# Patient Record
Sex: Female | Born: 1975 | Race: Black or African American | Hispanic: No | Marital: Married | Smoking: Never smoker
Health system: Southern US, Community
[De-identification: ages and names within clinical notes are randomized; demographics above are authoritative.]

## PROBLEM LIST (undated history)

## (undated) HISTORY — PX: DILATION AND CURETTAGE OF UTERUS: SHX78

---

## 2005-07-28 ENCOUNTER — Emergency Department: Payer: Self-pay | Admitting: Emergency Medicine

## 2006-08-11 ENCOUNTER — Other Ambulatory Visit: Payer: Self-pay

## 2006-08-11 ENCOUNTER — Emergency Department: Payer: Self-pay | Admitting: Emergency Medicine

## 2007-04-05 ENCOUNTER — Emergency Department: Payer: Self-pay | Admitting: Emergency Medicine

## 2008-02-08 ENCOUNTER — Emergency Department: Payer: Self-pay | Admitting: Internal Medicine

## 2008-03-28 ENCOUNTER — Encounter: Payer: Self-pay | Admitting: General Practice

## 2008-04-21 ENCOUNTER — Encounter: Payer: Self-pay | Admitting: General Practice

## 2009-03-25 ENCOUNTER — Emergency Department: Payer: Self-pay | Admitting: Internal Medicine

## 2009-03-27 ENCOUNTER — Emergency Department: Payer: Self-pay | Admitting: Emergency Medicine

## 2009-03-29 ENCOUNTER — Emergency Department: Payer: Self-pay | Admitting: Emergency Medicine

## 2011-03-29 ENCOUNTER — Emergency Department: Payer: Self-pay | Admitting: Emergency Medicine

## 2016-05-12 ENCOUNTER — Other Ambulatory Visit: Payer: Self-pay | Admitting: Family Medicine

## 2016-05-13 LAB — CMP12+LP+TP+TSH+6AC+CBC/D/PLT
ALT: 13 IU/L (ref 0–32)
AST: 13 IU/L (ref 0–40)
Albumin/Globulin Ratio: 1.4 (ref 1.2–2.2)
Albumin: 3.8 g/dL (ref 3.5–5.5)
Alkaline Phosphatase: 51 IU/L (ref 39–117)
BASOS: 1 %
BUN / CREAT RATIO: 7 — AB (ref 9–23)
BUN: 6 mg/dL (ref 6–24)
Basophils Absolute: 0 10*3/uL (ref 0.0–0.2)
Bilirubin Total: 0.3 mg/dL (ref 0.0–1.2)
CALCIUM: 8.8 mg/dL (ref 8.7–10.2)
CHOL/HDL RATIO: 2.7 ratio (ref 0.0–4.4)
CREATININE: 0.81 mg/dL (ref 0.57–1.00)
Chloride: 101 mmol/L (ref 96–106)
Cholesterol, Total: 188 mg/dL (ref 100–199)
EOS (ABSOLUTE): 0 10*3/uL (ref 0.0–0.4)
EOS: 1 %
Free Thyroxine Index: 1.9 (ref 1.2–4.9)
GFR calc Af Amer: 101 mL/min/{1.73_m2} (ref >59)
GFR, EST NON AFRICAN AMERICAN: 88 mL/min/{1.73_m2} (ref >59)
GGT: 14 IU/L (ref 0–60)
GLUCOSE: 88 mg/dL (ref 65–99)
Globulin, Total: 2.8 g/dL (ref 1.5–4.5)
HDL: 70 mg/dL (ref >39)
HEMATOCRIT: 39 % (ref 34.0–46.6)
HEMOGLOBIN: 12.6 g/dL (ref 11.1–15.9)
IMMATURE GRANULOCYTES: 0 %
IRON: 58 ug/dL (ref 27–159)
Immature Grans (Abs): 0 10*3/uL (ref 0.0–0.1)
LDH: 191 IU/L (ref 119–226)
LDL Calculated: 106 mg/dL — ABNORMAL HIGH (ref 0–99)
LYMPHS ABS: 1.7 10*3/uL (ref 0.7–3.1)
Lymphs: 32 %
MCH: 29.5 pg (ref 26.6–33.0)
MCHC: 32.3 g/dL (ref 31.5–35.7)
MCV: 91 fL (ref 79–97)
Monocytes Absolute: 0.3 10*3/uL (ref 0.1–0.9)
Monocytes: 6 %
NEUTROS ABS: 3.2 10*3/uL (ref 1.4–7.0)
Neutrophils: 60 %
PHOSPHORUS: 3 mg/dL (ref 2.5–4.5)
POTASSIUM: 4.4 mmol/L (ref 3.5–5.2)
Platelets: 352 10*3/uL (ref 150–379)
RBC: 4.27 x10E6/uL (ref 3.77–5.28)
RDW: 13.8 % (ref 12.3–15.4)
SODIUM: 140 mmol/L (ref 134–144)
T3 Uptake Ratio: 30 % (ref 24–39)
T4 TOTAL: 6.4 ug/dL (ref 4.5–12.0)
TOTAL PROTEIN: 6.6 g/dL (ref 6.0–8.5)
TSH: 1.32 u[IU]/mL (ref 0.450–4.500)
Triglycerides: 59 mg/dL (ref 0–149)
URIC ACID: 4.6 mg/dL (ref 2.5–7.1)
VLDL Cholesterol Cal: 12 mg/dL (ref 5–40)
WBC: 5.3 10*3/uL (ref 3.4–10.8)

## 2016-05-13 LAB — VITAMIN D 25 HYDROXY (VIT D DEFICIENCY, FRACTURES): VIT D 25 HYDROXY: 22.3 ng/mL — AB (ref 30.0–100.0)

## 2016-05-13 LAB — HGB A1C W/O EAG: HEMOGLOBIN A1C: 6 % — AB (ref 4.8–5.6)

## 2017-01-02 ENCOUNTER — Emergency Department: Payer: No Typology Code available for payment source

## 2017-01-02 ENCOUNTER — Encounter: Payer: Self-pay | Admitting: Emergency Medicine

## 2017-01-02 ENCOUNTER — Emergency Department
Admission: EM | Admit: 2017-01-02 | Discharge: 2017-01-02 | Disposition: A | Discharge status: Home / Self Care | Payer: No Typology Code available for payment source | Attending: Emergency Medicine | Admitting: Emergency Medicine

## 2017-01-02 DIAGNOSIS — Y929 Unspecified place or not applicable: Secondary | ICD-10-CM | POA: Insufficient documentation

## 2017-01-02 DIAGNOSIS — M62838 Other muscle spasm: Secondary | ICD-10-CM | POA: Diagnosis not present

## 2017-01-02 DIAGNOSIS — Y999 Unspecified external cause status: Secondary | ICD-10-CM | POA: Diagnosis not present

## 2017-01-02 DIAGNOSIS — Y939 Activity, unspecified: Secondary | ICD-10-CM | POA: Insufficient documentation

## 2017-01-02 DIAGNOSIS — X501XXA Overexertion from prolonged static or awkward postures, initial encounter: Secondary | ICD-10-CM | POA: Diagnosis not present

## 2017-01-02 DIAGNOSIS — S8391XA Sprain of unspecified site of right knee, initial encounter: Secondary | ICD-10-CM | POA: Insufficient documentation

## 2017-01-02 DIAGNOSIS — S8991XA Unspecified injury of right lower leg, initial encounter: Secondary | ICD-10-CM | POA: Diagnosis present

## 2017-01-02 MED ORDER — TRAMADOL HCL 50 MG PO TABS: 50.0000 mg | ORAL_TABLET | Freq: Four times a day (QID) | ORAL | 0 refills | Status: DC | PRN

## 2017-01-02 MED ORDER — CYCLOBENZAPRINE HCL 10 MG PO TABS: 10.0000 mg | ORAL_TABLET | Freq: Three times a day (TID) | ORAL | 0 refills | Status: DC | PRN

## 2017-01-02 MED ORDER — KETOROLAC TROMETHAMINE 30 MG/ML IJ SOLN
30.0000 mg | Freq: Once | INTRAMUSCULAR | Status: AC
  Administered 2017-01-02: 30 mg via INTRAMUSCULAR
  Filled 2017-01-02: qty 1

## 2017-01-02 MED ORDER — MELOXICAM 15 MG PO TABS: 15.0000 mg | ORAL_TABLET | Freq: Every day | ORAL | 0 refills | Status: DC

## 2017-01-02 NOTE — ED Triage Notes (Signed)
Knee pain since November , was seen at [facility] yesterday for increased pain , today felt pop to the lower thigh with a spasm feeling. Ear infection on ABX since Tuesday

## 2017-01-02 NOTE — ED Provider Notes (Signed)
[facility] Emergency Department Provider Note  ____________________________________________  Time seen: Approximately 1:03 PM  I have reviewed the triage vital signs and the nursing notes.   HISTORY  Chief Complaint Knee Pain    HPI Shari Shields is a 41 y.o. female , NAD, presents to emergency department for evaluation of right knee pain. Patient states she has had right knee pain off and on for approximately 2 months. Over the last week the pain seems to be increasingly worse. Felt a pop in the right distal thigh earlier today which cause shooting pain up and down the leg and prompted her visit to the emergency department. States she was seen at a local urgent care yesterday for the pain but no imaging was completed. Denies any recent injury, trauma or fall. Has not noted any redness, swelling or abnormal warmth. Denies back pain, saddle paresthesias or loss of bowel or bladder control. Denies any numbness, weakness, tingling. Does note that she can feel muscle spasms in her right thigh at times.   History reviewed. No pertinent past medical history.  There are no active problems to display for this patient.   Past Surgical History:  Procedure Laterality Date  . CESAREAN SECTION    . DILATION AND CURETTAGE OF UTERUS      Prior to Admission medications   Medication Sig Start Date End Date Taking? Authorizing Provider  cyclobenzaprine (FLEXERIL) 10 MG tablet Take 1 tablet (10 mg total) by mouth 3 (three) times daily as needed for muscle spasms. 01/02/17   Julias Mould L Zaheer Wageman, PA-C  meloxicam (MOBIC) 15 MG tablet Take 1 tablet (15 mg total) by mouth daily. 01/02/17   Pio Eatherly L Brealyn Baril, PA-C  traMADol (ULTRAM) 50 MG tablet Take 1 tablet (50 mg total) by mouth every 6 (six) hours as needed. 01/02/17   Darcel Frane L Tanika Bracco, PA-C    Allergies Patient has no known allergies.  No family history on file.  Social History Social History  Substance Use Topics  . Smoking  status: Never Smoker  . Smokeless tobacco: Never Used  . Alcohol use Yes     Review of Systems  Constitutional: No fever/chills Cardiovascular: No chest pain. Respiratory: No shortness of breath.  Musculoskeletal: Positive right thigh and knee pain. Negative for back pain.  Skin: Negative for rash, Redness, swelling, abnormal warmth, skin sores. Neurological: Negative for numbness, weakness, tingling.  ____________________________________________   PHYSICAL EXAM:  VITAL SIGNS: ED Triage Vitals  Enc Vitals Group     BP 01/02/17 1301 135/72     Pulse Rate 01/02/17 1301 72     Resp 01/02/17 1301 18     Temp 01/02/17 1301 98.4 F (36.9 C)     Temp Source 01/02/17 1301 Oral     SpO2 01/02/17 1301 99 %     Weight 01/02/17 1302 (!) 310 lb (140.6 kg)     Height 01/02/17 1302 5\' 5"  (1.651 m)     Head Circumference --      Peak Flow --      Pain Score 01/02/17 1302 10     Pain Loc --      Pain Edu? --      Excl. in GC? --      Constitutional: Alert and oriented. Well appearing and in no acute distress. Eyes: Conjunctivae are normal.  Head: Atraumatic. Cardiovascular: Good peripheral circulation with 2+ pulses noted in the right lower extremity. Capillary refill is brisk in all digits of the right foot. Respiratory:  Normal respiratory effort without tachypnea or retractions.  Musculoskeletal: Mild tenderness to palpation to the right distal and dorsal thigh muscle without significant muscle spasm.  No laxity with varus or valgus stress of the right knee. Negative patellofemoral grinding. No masses noted to palpation of posterior right knee. No tenderness to palpation of the right hip. Full range of motion of the right ankle and foot without pain or difficulty. No lower extremity tenderness nor edema.  No joint effusions. Neurologic:  Normal speech and language. No gross focal neurologic deficits are appreciated. Sensation to light touch grossly intact by the right lower  extremity. Skin:  Skin is warm, dry and intact. No rash, redness, swelling, abnormal warmth noted. Psychiatric: Mood and affect are normal. Speech and behavior are normal. Patient exhibits appropriate insight and judgement.   ____________________________________________   LABS  None ____________________________________________  EKG  None ____________________________________________  RADIOLOGY I, Hope PigeonJami L Jennaya Pogue, personally viewed and evaluated these images (plain radiographs) as part of my medical decision making, as well as reviewing the written report by the radiologist.  Dg Knee Complete 4 Views Right  Result Date: 01/02/2017 CLINICAL DATA:  Right knee pain for 2 months EXAM: RIGHT KNEE - COMPLETE 4+ VIEW COMPARISON:  None. FINDINGS: No acute fracture. No dislocation.  Unremarkable soft tissues. IMPRESSION: No acute bony pathology. Electronically Signed   By: Jolaine ClickArthur  Hoss M.D.   On: 01/02/2017 14:26    ____________________________________________    PROCEDURES  Procedure(s) performed: None   Procedures   Medications  ketorolac (TORADOL) 30 MG/ML injection 30 mg (30 mg Intramuscular Given 01/02/17 1443)   ____________________________________________   INITIAL IMPRESSION / ASSESSMENT AND PLAN / ED COURSE  Pertinent labs & imaging results that were available during my care of the patient were reviewed by me and considered in my medical decision making (see chart for details).  Clinical Course as of Jan 03 1628  Fri Jan 02, 2017  1500 Patient verbalizes wanting an MRI to further evaluate her knee pain. Discussed that her x-ray at this time was negative in my physical exam did not warrant further imaging at this time. Will refer the patient to orthopedics for further evaluation and treatment.  [JH]    Clinical Course User Index [JH] Justice Milliron L Charlissa Petros, PA-C    Patient's diagnosis is consistent with Right knee sprain and muscle spasms. Patient was given IM Toradol while  in the emergency department and noted mild decrease in pain. Patient will be discharged home with prescriptions for Flexeril, Motrin and Ultram to take as directed. Patient is to follow up with Dr. Martha ClanKrasinski in orthopedics if symptoms persist past this treatment course. Patient is given ED precautions to return to the ED for any worsening or new symptoms.    ____________________________________________  FINAL CLINICAL IMPRESSION(S) / ED DIAGNOSES  Final diagnoses:  Sprain of right knee, unspecified ligament, initial encounter  Muscle spasm      NEW MEDICATIONS STARTED DURING THIS VISIT:  Discharge Medication List as of 01/02/2017  3:13 PM    START taking these medications   Details  cyclobenzaprine (FLEXERIL) 10 MG tablet Take 1 tablet (10 mg total) by mouth 3 (three) times daily as needed for muscle spasms., Starting Fri 01/02/2017, Print    meloxicam (MOBIC) 15 MG tablet Take 1 tablet (15 mg total) by mouth daily., Starting Fri 01/02/2017, Print    traMADol (ULTRAM) 50 MG tablet Take 1 tablet (50 mg total) by mouth every 6 (six) hours as needed., Starting Fri 01/02/2017,  Print             Hope Pigeon, PA-C 01/02/17 1629    Emily Filbert, MD 01/03/17 1501

## 2017-04-02 ENCOUNTER — Ambulatory Visit: Payer: Self-pay | Admitting: Registered Nurse

## 2017-04-02 VITALS — BP 110/80 | HR 81 | Temp 98.1°F

## 2017-04-02 DIAGNOSIS — H6591 Unspecified nonsuppurative otitis media, right ear: Secondary | ICD-10-CM

## 2017-04-02 DIAGNOSIS — J019 Acute sinusitis, unspecified: Secondary | ICD-10-CM

## 2017-04-02 DIAGNOSIS — M25561 Pain in right knee: Secondary | ICD-10-CM

## 2017-04-02 DIAGNOSIS — IMO0001 Reserved for inherently not codable concepts without codable children: Secondary | ICD-10-CM

## 2017-04-02 DIAGNOSIS — H6592 Unspecified nonsuppurative otitis media, left ear: Secondary | ICD-10-CM

## 2017-04-02 MED ORDER — MELOXICAM 15 MG PO TABS: 15.0000 mg | ORAL_TABLET | Freq: Every day | ORAL | 0 refills | Status: DC

## 2017-04-02 MED ORDER — LORATADINE 10 MG PO TABS: 10.0000 mg | ORAL_TABLET | Freq: Every day | ORAL | 1 refills | Status: DC | PRN

## 2017-04-02 NOTE — Patient Instructions (Signed)
Continue mobic  po daily Avoid motrin/ibuprofen/naprosyn/naproxen/advil/aleve while taking mobic Hydrate May apply ice or heat to knee 15 minutes 4 times per day May use ace wrap to provide knee support Recommend weight loss Recommend alternating shoes every other day and buy new pair after every 300-500 miles typically every 3-6 months Start amoxicillin  by mouth twice a day x 10 days Restart flonase 1 spray each nostril at home Shower twice a day Nasal saline 2 sprays each nostril every 2 hours while awake/in shower as needed for congestion   Lateral Collateral Knee Ligament Sprain, Phase I Rehab Ask your health care provider which exercises are safe for you. Do exercises exactly as told by your health care provider and adjust them as directed. It is normal to feel mild stretching, pulling, tightness, or discomfort as you do these exercises, but you should stop right away if you feel sudden pain or your pain gets worse.Do not begin these exercises until told by your health care provider. Stretching and range of motion exercises These exercises warm up your muscles and joints and improve the movement and flexibility of your knee. These exercises also help to relieve pain, numbness, and tingling. Exercise A: Heel slide   1. Lie on your back with both knees straight. If this causes back discomfort, bend your healthy knee, placing your foot flat on the floor. 2. Slowly slide your left / right heel back toward your buttocks until you feel a gentle stretch in the front of your knee or thigh. 3. Hold this position for __________ seconds. 4. Slowly slide your left / right heel to the starting position. Repeat __________ times. Complete this exercise __________ times a day. Strengthening exercises These exercises build strength and endurance in your knee. Endurance is the ability to use your muscles for a long time, even after they get tired. Exercise B: Straight leg raises (    quadriceps) 1. Lie on your back with your left / right leg extended and your other knee bent. 2. Tense the muscles in the front of your left / right thigh. You should see your kneecap slide up or see increased dimpling just above the knee. Your thigh may even shake a bit. 3. Keep these muscles tight as you raise your leg 4-6 inches (10-15 cm) off the floor. Do not let your knee bend. If you cannot do this exercise without bending your knee, tighten the muscles in the front of your left / right thigh but do not lift your leg. 4. Hold this position for __________ seconds. 5. Keep these muscles tense as you lower your leg. 6. Relax the muscles slowly and completely. Repeat __________ times. Complete this exercise __________ times a day. Exercise C: Hamstring curls  If told by your health care provider, do this exercise while wearing ankle weights. Begin with __________ weights. Then increase the weight by 1 lb (0.5 kg) increments. Do not wear ankle weights that are more than __________. 1. Lie on your abdomen with your legs straight. 2. Bend your left / right knee as far as you can comfortably do that. Keep your hips flat on the surface that is under them. When you bend your knee, bring your foot straight toward your buttock. Do not let it fall in or out. 3. Hold this position for __________ seconds. 4. Slowly lower your leg to the starting position. Repeat __________ times. Complete this exercise __________ times a day. Exercise D: Bridge ( hip extensors) 1. Lie on your back  on a firm surface with your knees bent and your feet flat on the floor. 2. Tighten your buttocks muscles and lift your bottom off the floor until your trunk is level with your thighs.  Do not arch your back.  You should feel the muscles working in your buttocks and the back of your thighs. If you do not feel a stretch in these muscles, slide your feet 1-2 inches (2.5-5 cm) farther away from your buttocks. 3. Hold this  position for __________ seconds. 4. Slowly lower your hips to the starting position. 5. Let your buttocks muscles relax completely between repetitions. 6. If this exercise is too easy, try doing it with your arms crossed over your chest or by lifting one leg while your bottom is up off the floor. Repeat __________ times. Complete this exercise __________ times a day. Exercise E: Heel raise 1. Stand with your feet shoulder-width apart. 2. Keep your weight spread evenly over the width of your feet while you rise up on your toes. Use a wall or table to steady yourself, but try not to use it very much for support. 3. If this exercise is too easy, shift your weight toward your left / right leg until you feel challenged. 4. Hold this position for __________ seconds. 5. Slowly lower yourself to the starting position. Repeat __________ times. Complete this exercise __________ times a day. This information is not intended to replace advice given to you by your health care provider. Make sure you discuss any questions you have with your health care provider. Document Released: 12/08/2005 Document Revised: 08/14/2016 Document Reviewed: 10/20/2015 Elsevier Interactive Patient Education  2017 [employer] Allergic Rhinitis Allergic rhinitis is when the mucous membranes in the nose respond to allergens. Allergens are particles in the air that cause your body to have an allergic reaction. This causes you to release allergic antibodies. Through a chain of events, these eventually cause you to release histamine into the blood stream. Although meant to protect the body, it is this release of histamine that causes your discomfort, such as frequent sneezing, congestion, and an itchy, runny nose. What are the causes? Seasonal allergic rhinitis (hay fever) is caused by pollen allergens that may come from grasses, trees, and weeds. Year-round allergic rhinitis (perennial allergic rhinitis) is caused by allergens such  as house dust mites, pet dander, and mold spores. What are the signs or symptoms?  Nasal stuffiness (congestion).  Itchy, runny nose with sneezing and tearing of the eyes. How is this diagnosed? Your health care provider can help you determine the allergen or allergens that trigger your symptoms. If you and your health care provider are unable to determine the allergen, skin or blood testing may be used. Your health care provider will diagnose your condition after taking your health history and performing a physical exam. Your health care provider may assess you for other related conditions, such as asthma, pink eye, or an ear infection. How is this treated? Allergic rhinitis does not have a cure, but it can be controlled by:  Medicines that block allergy symptoms. These may include allergy shots, nasal sprays, and oral antihistamines.  Avoiding the allergen. Hay fever may often be treated with antihistamines in pill or nasal spray forms. Antihistamines block the effects of histamine. There are over-the-counter medicines that may help with nasal congestion and swelling around the eyes. Check with your health care provider before taking or giving this medicine. If avoiding the allergen or the medicine prescribed do not work,  there are many new medicines your health care provider can prescribe. Stronger medicine may be used if initial measures are ineffective. Desensitizing injections can be used if medicine and avoidance does not work. Desensitization is when a patient is given ongoing shots until the body becomes less sensitive to the allergen. Make sure you follow up with your health care provider if problems continue. Follow these instructions at home: It is not possible to completely avoid allergens, but you can reduce your symptoms by taking steps to limit your exposure to them. It helps to know exactly what you are allergic to so that you can avoid your specific triggers. Contact a health care  provider if:  You have a fever.  You develop a cough that does not stop easily (persistent).  You have shortness of breath.  You start wheezing.  Symptoms interfere with normal daily activities. This information is not intended to replace advice given to you by your health care provider. Make sure you discuss any questions you have with your health care provider. Document Released: 09/02/2001 Document Revised: 08/08/2016 Document Reviewed: 08/15/2013 Elsevier Interactive Patient Education  2017 [employer] Otitis Media, Adult Otitis media is redness, soreness, and puffiness (swelling) in the space just behind your eardrum (middle ear). It may be caused by allergies or infection. It often happens along with a cold. Follow these instructions at home:  Take your medicine as told. Finish it even if you start to feel better.  Only take over-the-counter or prescription medicines for pain, discomfort, or fever as told by your doctor.  Follow up with your doctor as told. Contact a doctor if:  You have otitis media only in one ear, or bleeding from your nose, or both.  You notice a lump on your neck.  You are not getting better in 3-5 days.  You feel worse instead of better. Get help right away if:  You have pain that is not helped with medicine.  You have puffiness, redness, or pain around your ear.  You get a stiff neck.  You cannot move part of your face (paralysis).  You notice that the bone behind your ear hurts when you touch it. This information is not intended to replace advice given to you by your health care provider. Make sure you discuss any questions you have with your health care provider. Document Released: 05/26/2008 Document Revised: 05/15/2016 Document Reviewed: 07/05/2013 Elsevier Interactive Patient Education  2017 [employer] Tendinitis Tendinitis is inflammation of a tendon. A tendon is a strong cord of tissue that connects muscle to bone. Tendinitis  can affect any tendon, but it most commonly affects the shoulder tendon (rotator cuff), ankle tendon (Achilles tendon), elbow tendon (triceps tendon), or one of the tendons in the wrist. What are the causes? This condition may be caused by:  Overusing a tendon or muscle. This is common.  Age-related wear and tear.  Injury.  Inflammatory conditions, such as arthritis.  Certain medicines. What increases the risk? This condition is more likely to develop in people who do activities that involve repetitive motions. What are the signs or symptoms? Symptoms of this condition may include:  Pain.  Tenderness.  Mild swelling. How is this diagnosed? This condition is diagnosed with a physical exam. You may also have tests, such as:  Ultrasound. This uses sound waves to make an image of your affected area.  MRI. How is this treated? This condition may be treated by resting, icing, applying pressure (compression), and raising (elevating) the  area above the level of your heart. This is known as RICE therapy. Treatment may also include:  Medicines to help reduce inflammation or to help reduce pain.  Exercises or physical therapy to strengthen and stretch the tendon.  A brace or splint.  Surgery (rare). Follow these instructions at home:   If you have a splint or brace:   Wear the splint or brace as told by your health care provider. Remove it only as told by your health care provider.  Loosen the splint or brace if your fingers or toes tingle, become numb, or turn cold and blue.  Do not take baths, swim, or use a hot tub until your health care provider approves. Ask your health care provider if you can take showers. You may only be allowed to take sponge baths for bathing.  Do not let your splint or brace get wet if it is not waterproof.  If your splint or brace is not waterproof, cover it with a watertight plastic bag when you take a bath or a shower.  Keep the splint or brace  clean. Managing pain, stiffness, and swelling   If directed, apply ice to the affected area.  Put ice in a plastic bag.  Place a towel between your skin and the bag.  Leave the ice on for 20 minutes, 2-3 times a day.  If directed, apply heat to the affected area as often as told by your health care provider. Use the heat source that your health care provider recommends, such as a moist heat pack or a heating pad.  Place a towel between your skin and the heat source.  Leave the heat on for 20-30 minutes.  Remove the heat if your skin turns bright red. This is especially important if you are unable to feel pain, heat, or cold. You may have a greater risk of getting burned.  Move the fingers or toes of the affected limb often, if this applies. This can help to prevent stiffness and lessen swelling.  If directed, elevate the affected area above the level of your heart while you are sitting or lying down. Driving   Do not drive or operate heavy machinery while taking prescription pain medicine.  Ask your health care provider when it is safe to drive if you have a splint or brace on any part of your arm or leg. Activity   Return to your normal activities as told by your health care provider. Ask your health care provider what activities are safe for you.  Rest the affected area as told by your health care provider.  Avoid using the affected area while you are experiencing symptoms of tendinitis.  Do exercises as told by your health care provider. General instructions   If you have a splint, do not put pressure on any part of the splint until it is fully hardened. This may take several hours.  Wear an elastic bandage or compression wrap only as told by your health care provider.  Take over-the-counter and prescription medicines only as told by your health care provider.  Keep all follow-up visits as told by your health care provider. This is important. Contact a health care  provider if:  Your symptoms do not improve.  You develop new, unexplained problems, such as numbness in your hands. This information is not intended to replace advice given to you by your health care provider. Make sure you discuss any questions you have with your health care provider. Document Released: 12/05/2000  Document Revised: 08/07/2016 Document Reviewed: 09/10/2015 Elsevier Interactive Patient Education  2017 [employer]  Sinusitis, Adult Sinusitis is soreness and inflammation of your sinuses. Sinuses are hollow spaces in the bones around your face. They are located:  Around your eyes.  In the middle of your forehead.  Behind your nose.  In your cheekbones. Your sinuses and nasal passages are lined with a stringy fluid (mucus). Mucus normally drains out of your sinuses. When your nasal tissues get inflamed or swollen, the mucus can get trapped or blocked so air cannot flow through your sinuses. This lets bacteria, viruses, and funguses grow, and that leads to infection. Follow these instructions at home: Medicines   Take, use, or apply over-the-counter and prescription medicines only as told by your doctor. These may include nasal sprays.  If you were prescribed an antibiotic medicine, take it as told by your doctor. Do not stop taking the antibiotic even if you start to feel better. Hydrate and Humidify   Drink enough water to keep your pee (urine) clear or pale yellow.  Use a cool mist humidifier to keep the humidity level in your home above 50%.  Breathe in steam for 10-15 minutes, 3-4 times a day or as told by your doctor. You can do this in the bathroom while a hot shower is running.  Try not to spend time in cool or dry air. Rest   Rest as much as possible.  Sleep with your head raised (elevated).  Make sure to get enough sleep each night. General instructions   Put a warm, moist washcloth on your face 3-4 times a day or as told by your doctor. This will  help with discomfort.  Wash your hands often with soap and water. If there is no soap and water, use hand sanitizer.  Do not smoke. Avoid being around people who are smoking (secondhand smoke).  Keep all follow-up visits as told by your doctor. This is important. Contact a doctor if:  You have a fever.  Your symptoms get worse.  Your symptoms do not get better within 10 days. Get help right away if:  You have a very bad headache.  You cannot stop throwing up (vomiting).  You have pain or swelling around your face or eyes.  You have trouble seeing.  You feel confused.  Your neck is stiff.  You have trouble breathing. This information is not intended to replace advice given to you by your health care provider. Make sure you discuss any questions you have with your health care provider. Document Released: 05/26/2008 Document Revised: 08/03/2016 Document Reviewed: 10/03/2015 Elsevier Interactive Patient Education  2017 [employer].

## 2017-04-02 NOTE — Progress Notes (Signed)
Subjective:    Patient ID: Shari Shields, female    DOB: [birth year], 41 y.o.   MRN: [id number]  40y/o african [place] female here reports nasal/sinus congestion that began yesterday. Started with nonproductive cough today. +sore throat, difficultly swallowing. Afebrile at home.  Also taking Tramadol for continued R knee pain. Out of Mobic. Requesting refill or suggestions r/t to knee pain. Dx with R knee sprain January 2018. But pain has continued. Now worse with cold weather. After sitting still for a long time, becomes tight. Has been alternating shoes current pair 3 months old.  Denied locking or giving out.  Occasional swelling hurts in thigh and knee.  Patient felt mobic helped more than the tramadol.  Patient has also tried tylenol extra strength without much relief of pain.  Does not interrupt her sleep.      Review of Systems  Constitutional: Negative for activity change, appetite change, chills, diaphoresis, fatigue, fever and unexpected weight change.  HENT: Positive for congestion, postnasal drip, sore throat and trouble swallowing. Negative for dental problem, drooling, ear discharge, ear pain, facial swelling, hearing loss, mouth sores, nosebleeds, rhinorrhea, sinus pain, sinus pressure, sneezing, tinnitus and voice change.   Eyes: Negative for photophobia, pain, discharge, redness, itching and visual disturbance.  Respiratory: Negative for cough, choking, chest tightness, shortness of breath, wheezing and stridor.   Cardiovascular: Negative for chest pain, palpitations and leg swelling.  Gastrointestinal: Negative for abdominal distention, abdominal pain, blood in stool, constipation, diarrhea, nausea and vomiting.  Endocrine: Negative for cold intolerance and heat intolerance.  Genitourinary: Negative for difficulty urinating, dysuria and hematuria.  Musculoskeletal: Positive for arthralgias, joint swelling and myalgias. Negative for back pain, gait problem, neck pain and  neck stiffness.  Skin: Negative for color change, pallor, rash and wound.  Allergic/Immunologic: Positive for environmental allergies. Negative for food allergies.  Neurological: Negative for dizziness, tremors, seizures, syncope, facial asymmetry, speech difficulty, weakness, light-headedness, numbness and headaches.  Hematological: Negative for adenopathy. Does not bruise/bleed easily.  Psychiatric/Behavioral: Negative for agitation, behavioral problems, confusion and sleep disturbance.       Objective:   Physical Exam  Constitutional: She is oriented to person, place, and time. Vital signs are normal. She appears well-developed and well-nourished. She is active and cooperative.  Non-toxic appearance. She does not have a sickly appearance. She does not appear ill. No distress.  HENT:  Head: Normocephalic and atraumatic.  Right Ear: Hearing, external ear and ear canal normal. Tympanic membrane is erythematous and bulging. A middle ear effusion is present.  Left Ear: Hearing, external ear and ear canal normal. Tympanic membrane is erythematous and bulging. A middle ear effusion is present.  Nose: Mucosal edema and rhinorrhea present. No nose lacerations, sinus tenderness, nasal deformity, septal deviation or nasal septal hematoma. No epistaxis.  No foreign bodies. Right sinus exhibits no maxillary sinus tenderness and no frontal sinus tenderness. Left sinus exhibits no maxillary sinus tenderness and no frontal sinus tenderness.  Mouth/Throat: Uvula is midline and mucous membranes are normal. Mucous membranes are not pale, not dry and not cyanotic. She does not have dentures. No oral lesions. No trismus in the jaw. Normal dentition. No dental abscesses, uvula swelling, lacerations or dental caries. Posterior oropharyngeal edema and posterior oropharyngeal erythema present. No oropharyngeal exudate or tonsillar abscesses.  Left greater than right erythematous bulging injected TM greater than 50% air  fluid level clear; bilateral allergic shiners; bilateral nasal turbinates edema erythema clear discharge; cobblestoning posterior pharynx  Eyes: Conjunctivae, EOM and  lids are normal. Pupils are equal, round, and reactive to light. Right eye exhibits no chemosis, no discharge, no exudate and no hordeolum. No foreign body present in the right eye. Left eye exhibits no chemosis, no discharge, no exudate and no hordeolum. No foreign body present in the left eye. Right conjunctiva is not injected. Right conjunctiva has no hemorrhage. Left conjunctiva is not injected. Left conjunctiva has no hemorrhage. No scleral icterus. Right eye exhibits normal extraocular motion and no nystagmus. Left eye exhibits normal extraocular motion and no nystagmus. Right pupil is round and reactive. Left pupil is round and reactive. Pupils are equal.  Neck: Trachea normal and normal range of motion. Neck supple. No tracheal tenderness, no spinous process tenderness and no muscular tenderness present. No neck rigidity. No tracheal deviation, no edema, no erythema and normal range of motion present. No thyroid mass and no thyromegaly present.  Cardiovascular: Normal rate, regular rhythm, S1 normal, S2 normal, normal heart sounds and intact distal pulses.  PMI is not displaced.  Exam reveals no gallop and no friction rub.   No murmur heard. Pulses:      Popliteal pulses are 2+ on the right side, and 2+ on the left side.  Pulmonary/Chest: Effort normal. No accessory muscle usage or stridor. No respiratory distress. She has no decreased breath sounds. She has wheezes in the right middle field. She has no rhonchi. She has no rales. She exhibits no tenderness.  Intermittent wheeze expiratory RML; speaks full sentences without difficulty  Abdominal: Soft. She exhibits no distension.  Musculoskeletal: Normal range of motion. She exhibits tenderness. She exhibits no edema or deformity.       Right shoulder: Normal.       Left shoulder:  Normal.       Right hip: Normal.       Left hip: Normal.       Right knee: She exhibits LCL laxity. She exhibits normal range of motion, no swelling, no effusion, no ecchymosis, no deformity, no laceration, no erythema, normal alignment, normal patellar mobility, no bony tenderness, normal meniscus and no MCL laxity. Tenderness found. Medial joint line, lateral joint line, LCL and patellar tendon tenderness noted. No MCL tenderness noted.       Left knee: Normal.       Right ankle: Normal.       Left ankle: Normal.       Cervical back: Normal.       Right hand: Normal.       Left hand: Normal.  TTP medial VMO right thigh no edema or erythema noted; Mild laxity with varus stress right knee-LCL; treads worn on patient shoes entire sole; TTP superior, medial and lateral patellar tendons and medial and lateral joint line; negative posterior/anterior drawer sign; negative lachman's test; negative valgus stress test, negative mcmurray's test  Lymphadenopathy:       Head (right side): No submental, no submandibular, no tonsillar, no preauricular, no posterior auricular and no occipital adenopathy present.       Head (left side): No submental, no submandibular, no tonsillar, no preauricular, no posterior auricular and no occipital adenopathy present.    She has no cervical adenopathy.       Right cervical: No superficial cervical, no deep cervical and no posterior cervical adenopathy present.      Left cervical: No superficial cervical, no deep cervical and no posterior cervical adenopathy present.  Neurological: She is alert and oriented to person, place, and time. She has normal strength.  She is not disoriented. She displays no atrophy and no tremor. No cranial nerve deficit or sensory deficit. She exhibits normal muscle tone. She displays no seizure activity. Coordination and gait normal. GCS eye subscore is 4. GCS verbal subscore is 5. GCS motor subscore is 6.  No limp observed; on/off exam table  without difficulty and in/out chair  Skin: Skin is warm, dry and intact. No abrasion, no bruising, no burn, no ecchymosis, no laceration, no lesion, no petechiae and no rash noted. She is not diaphoretic. No cyanosis or erythema. No pallor. Nails show no clubbing.  Psychiatric: She has a normal mood and affect. Her speech is normal and behavior is normal. Judgment and thought content normal. Cognition and memory are normal.  Nursing note and vitals reviewed.         Assessment & Plan:  A-right otitis media acute nonsupportive, left otitis media effusion, acute rhinitis; right knee pain  P- Treatment as ordered.  Start amoxicillin  po BID x 10 days #20 RF0 dispensed from [facility]. Motrin and tylenol at home discussed max doses motrin  po TID prn pain and tylenol  po QID prn pain.   Symptomatic therapy suggested fluids, NSAIDs and rest.  May take Tylenol or Motrin for fevers.  Call or return to clinic as needed if these symptoms worsen or fail to improve as anticipated. Exitcare handout on otitis media given to patient.  Patient verbalized agreement and understanding of treatment plan.   P2:  Hand washing  Supportive treatment.   No evidence of invasive bacterial infection, non toxic and well hydrated.  This is most likely self limiting viral infection.  I do not see where any further testing or imaging is necessary at this time.   I will suggest supportive care, rest, good hygiene and encourage the patient to take adequate fluids.  The patient is to return to clinic or EMERGENCY ROOM if symptoms worsen or change significantly e.g. ear pain, fever, purulent discharge from ears or bleeding.  Exitcare handout on otitis media with effusion given to patient.  Patient verbalized agreement and understanding of treatment plan.    Restart flonase 1 spray each nostril BID at home.  Patient may use normal saline nasal spray as needed.  Consider antihistamine zyrtec  po daily OTC.  Patient may  continue phenylephrine  po QID prn rhinitis.   Avoid triggers if possible.  Shower prior to bedtime if exposed to triggers.  If allergic dust/dust mites recommend mattress/pillow covers/encasements; washing linens, vacuuming, sweeping, dusting weekly.  Call or return to clinic as needed if these symptoms worsen or fail to improve as anticipated.   Exitcare handout on allergic rhinitis given to patient.  Patient verbalized understanding of instructions, agreed with plan of care and had no further questions at this time.  P2:  Avoidance and hand washing  Refilled mobic  po daily #30 RF0.  Discussed with patient avoid motrin/advil/aleve/naproxen/naprosyn while taking mobic.  May still take tylenol  po QID prn. Shoes worn replace and alternate do not wear same pair every day.  Discussed weight loss would be helpful to decrease knee pain.  Discussed typical arthritis pain worse with cold weather.  Reviewed Jan 2018 xray knee right results with patient negative for arthritis/fracture/dislocation.  Consider compression.  Call or return to clinic as needed if these symptoms worsen or fail to improve as anticipated.  Patient verbalized agreement and understanding of treatment plan and had no further questions at this time.   P2:  ROM exercises, Stretching, and weight loss

## 2017-05-14 ENCOUNTER — Ambulatory Visit: Payer: Self-pay | Admitting: *Deleted

## 2017-05-14 VITALS — BP 118/82 | Ht 66.5 in | Wt 313.0 lb

## 2017-05-14 DIAGNOSIS — Z0000 Encounter for general adult medical examination without abnormal findings: Secondary | ICD-10-CM

## 2017-05-14 NOTE — Progress Notes (Signed)
[employer] insurance premium discount evaluation: Labs Drawn. Replacements ROI form signed. Tobacco Free Attestation form signed.  Forms placed in paper chart.  

## 2017-05-15 LAB — CMP12+LP+TP+TSH+6AC+CBC/D/PLT
A/G RATIO: 1.4 (ref 1.2–2.2)
ALBUMIN: 4.1 g/dL (ref 3.5–5.5)
ALK PHOS: 53 IU/L (ref 39–117)
ALT: 15 IU/L (ref 0–32)
AST: 18 IU/L (ref 0–40)
BASOS ABS: 0 10*3/uL (ref 0.0–0.2)
BILIRUBIN TOTAL: 0.3 mg/dL (ref 0.0–1.2)
BUN / CREAT RATIO: 15 (ref 9–23)
BUN: 11 mg/dL (ref 6–24)
Basos: 1 %
CALCIUM: 9.4 mg/dL (ref 8.7–10.2)
CHOLESTEROL TOTAL: 218 mg/dL — AB (ref 100–199)
CREATININE: 0.75 mg/dL (ref 0.57–1.00)
Chloride: 103 mmol/L (ref 96–106)
Chol/HDL Ratio: 3 ratio (ref 0.0–4.4)
EOS (ABSOLUTE): 0.1 10*3/uL (ref 0.0–0.4)
EOS: 1 %
Free Thyroxine Index: 2 (ref 1.2–4.9)
GFR, EST AFRICAN AMERICAN: 115 mL/min/{1.73_m2} (ref >59)
GFR, EST NON AFRICAN AMERICAN: 100 mL/min/{1.73_m2} (ref >59)
GGT: 17 IU/L (ref 0–60)
GLOBULIN, TOTAL: 2.9 g/dL (ref 1.5–4.5)
Glucose: 80 mg/dL (ref 65–99)
HDL: 72 mg/dL (ref >39)
HEMOGLOBIN: 13.1 g/dL (ref 11.1–15.9)
Hematocrit: 40.1 % (ref 34.0–46.6)
Immature Grans (Abs): 0 10*3/uL (ref 0.0–0.1)
Immature Granulocytes: 0 %
Iron: 72 ug/dL (ref 27–159)
LDH: 208 IU/L (ref 119–226)
LDL CALC: 133 mg/dL — AB (ref 0–99)
LYMPHS ABS: 1.6 10*3/uL (ref 0.7–3.1)
Lymphs: 33 %
MCH: 29.3 pg (ref 26.6–33.0)
MCHC: 32.7 g/dL (ref 31.5–35.7)
MCV: 90 fL (ref 79–97)
Monocytes Absolute: 0.3 10*3/uL (ref 0.1–0.9)
Monocytes: 5 %
NEUTROS ABS: 2.9 10*3/uL (ref 1.4–7.0)
Neutrophils: 60 %
PLATELETS: 344 10*3/uL (ref 150–379)
POTASSIUM: 4.4 mmol/L (ref 3.5–5.2)
Phosphorus: 3.5 mg/dL (ref 2.5–4.5)
RBC: 4.47 x10E6/uL (ref 3.77–5.28)
RDW: 13.6 % (ref 12.3–15.4)
SODIUM: 140 mmol/L (ref 134–144)
T3 UPTAKE RATIO: 30 % (ref 24–39)
T4 TOTAL: 6.6 ug/dL (ref 4.5–12.0)
TRIGLYCERIDES: 67 mg/dL (ref 0–149)
TSH: 1.15 u[IU]/mL (ref 0.450–4.500)
Total Protein: 7 g/dL (ref 6.0–8.5)
Uric Acid: 4.2 mg/dL (ref 2.5–7.1)
VLDL Cholesterol Cal: 13 mg/dL (ref 5–40)
WBC: 4.8 10*3/uL (ref 3.4–10.8)

## 2017-05-15 LAB — HGB A1C W/O EAG: HEMOGLOBIN A1C: 5.5 % (ref 4.8–5.6)

## 2017-05-15 LAB — VITAMIN D 25 HYDROXY (VIT D DEFICIENCY, FRACTURES): VIT D 25 HYDROXY: 22.9 ng/mL — AB (ref 30.0–100.0)

## 2017-05-19 NOTE — Progress Notes (Signed)
Lab results reviewed. Discussed increasing vitamin D supplement dose. Has previously taken 50000u weekly. Advised to discuss her options with pcp. Total cholesterol and LDL increased from previous. A1c improved to WNL. Endorses improved overall diet with more vegetables and better portion control. Red meat at least once weekly. Will try to limit more for cholesterol purposes. No routing to pcp requested. Copy provided to pt. No further questions or concerns.

## 2017-11-12 IMAGING — CR DG KNEE COMPLETE 4+V*R*
4 series · 4 of 4 positions shown · non-contrast
Comparison: None.

CLINICAL DATA: Right knee pain for 2 months

EXAM:
RIGHT KNEE - COMPLETE 4+ VIEW

[knee ap]
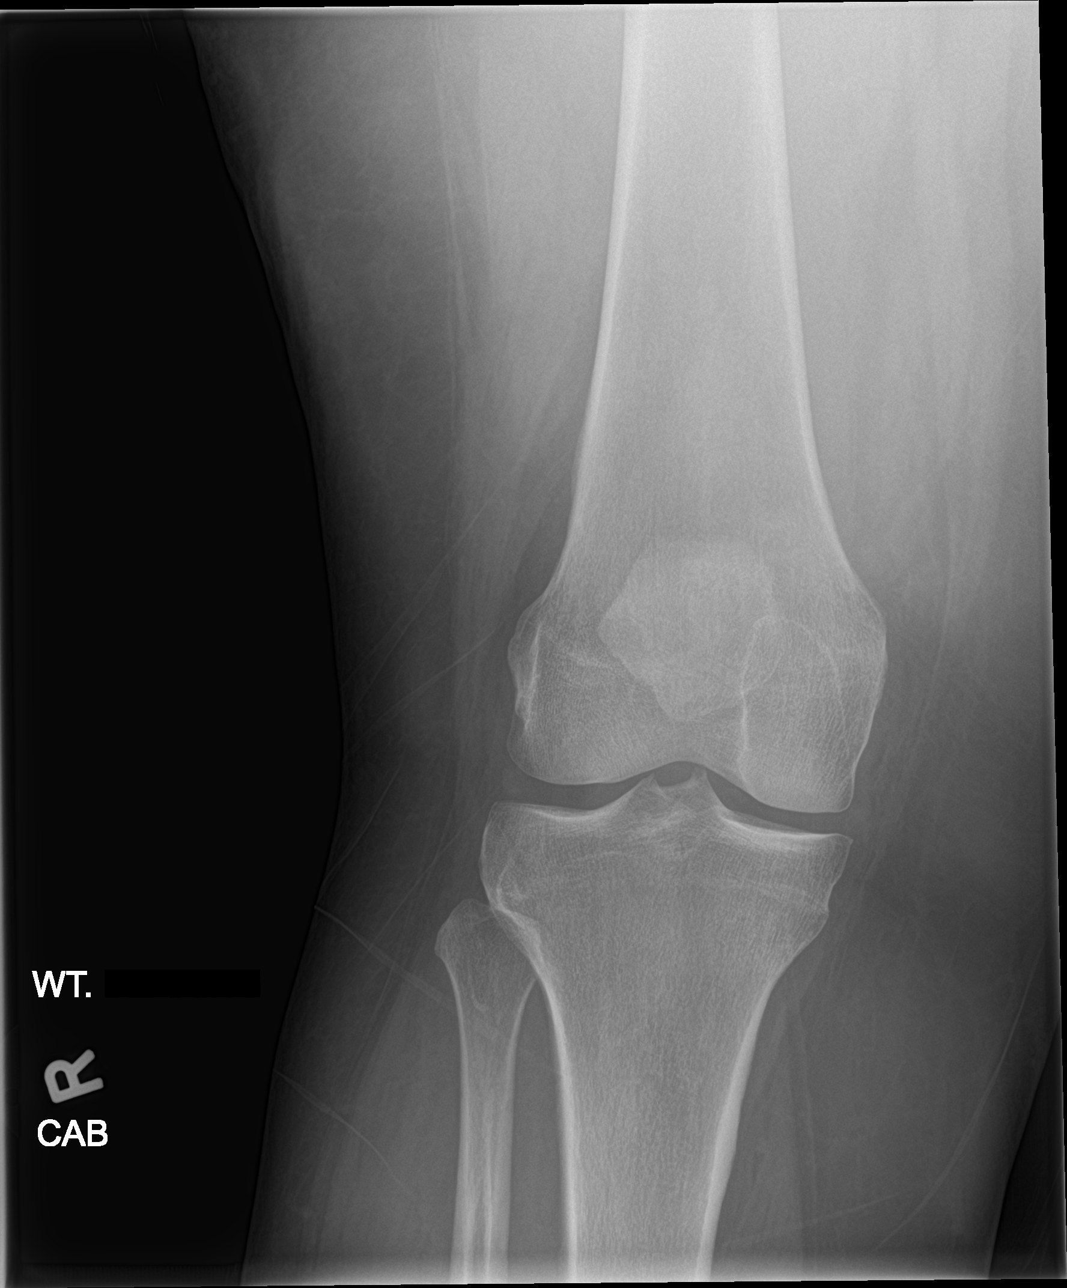

[knee obl]
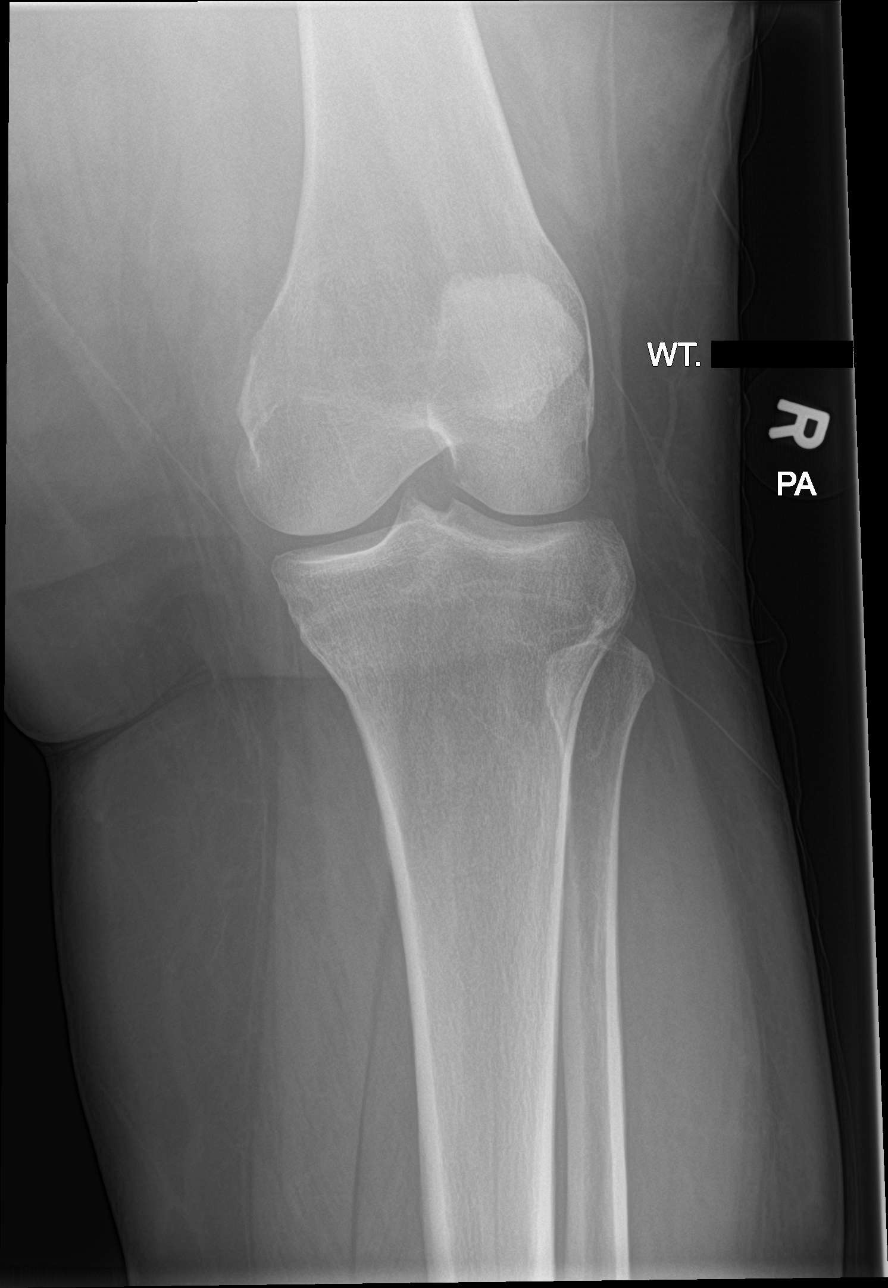

[knee lat]
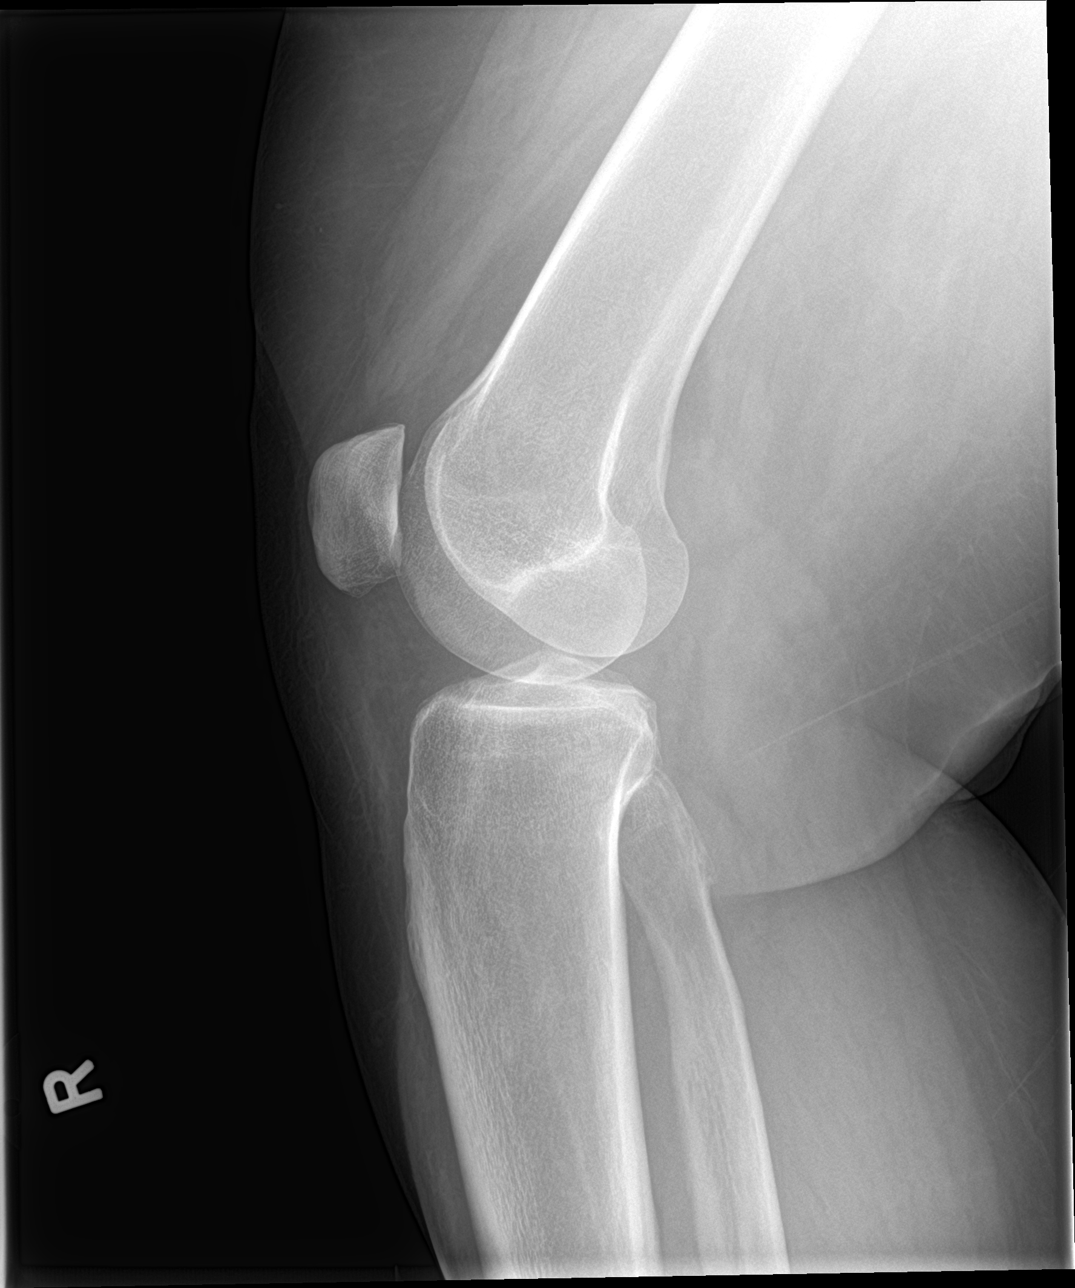

[sunrise]
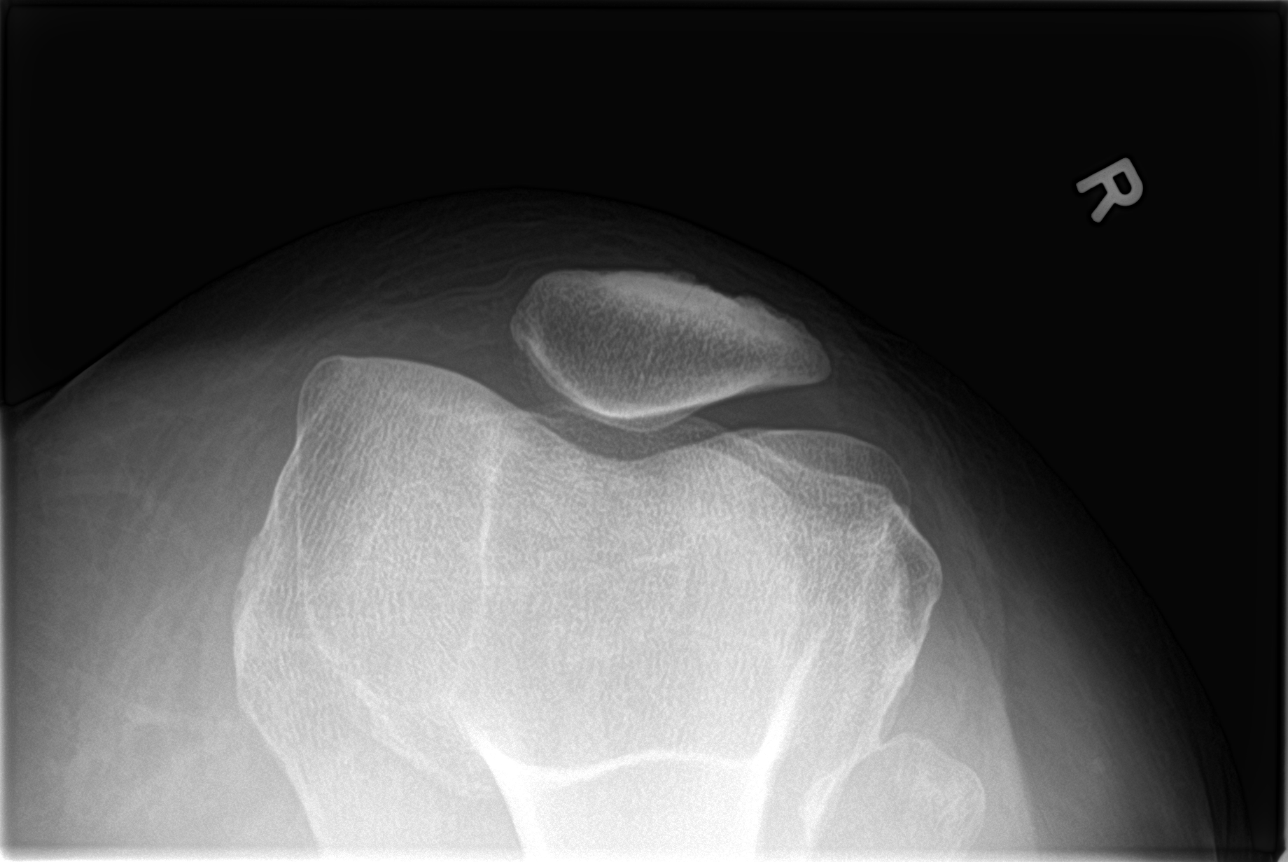

[4 of 4 positions shown; findings below may reference images not displayed]

FINDINGS: No acute fracture. No dislocation.  Unremarkable soft tissues.
IMPRESSION: No acute bony pathology.

## 2017-11-17 ENCOUNTER — Ambulatory Visit: Payer: Self-pay | Admitting: Registered Nurse

## 2017-11-17 VITALS — BP 132/101 | HR 72 | Temp 99.3°F

## 2017-11-17 DIAGNOSIS — H6691 Otitis media, unspecified, right ear: Secondary | ICD-10-CM

## 2017-11-17 DIAGNOSIS — R030 Elevated blood-pressure reading, without diagnosis of hypertension: Secondary | ICD-10-CM

## 2017-11-17 MED ORDER — SALINE SPRAY 0.65 % NA SOLN: 2.0000 | NASAL | 0 refills | Status: DC

## 2017-11-17 MED ORDER — FLUTICASONE PROPIONATE 50 MCG/ACT NA SUSP: 2.0000 | Freq: Every day | NASAL | 0 refills | Status: DC

## 2017-11-17 MED ORDER — AMOXICILLIN 875 MG PO TABS: 875.0000 mg | ORAL_TABLET | Freq: Two times a day (BID) | ORAL | 0 refills | Status: DC

## 2017-11-17 MED ORDER — ACETAMINOPHEN 500 MG PO TABS: 1000.0000 mg | ORAL_TABLET | Freq: Four times a day (QID) | ORAL | 0 refills | Status: AC | PRN

## 2017-11-17 NOTE — Progress Notes (Signed)
Subjective:    Patient ID: Shari Shields, female    DOB: 29-Jul-1976, 41 y.o.   MRN: [id number]  40y/o african american female established Pt c/o L ear pain x1 week. Pain extending down in to L neck.  Intermittent mild discomfort right ear. Denies ear drainage or hearing changes. Has taken motrin for pain prn po.  Reports BP normally 120's/90's. [facility] [place], [place]. Has never been told of BP issues.      Review of Systems  Constitutional: Negative for activity change, appetite change, chills, diaphoresis, fatigue, fever and unexpected weight change.  HENT: Positive for ear pain. Negative for congestion, dental problem, drooling, ear discharge, facial swelling, hearing loss, mouth sores, nosebleeds, postnasal drip, rhinorrhea, sinus pressure, sinus pain, sneezing, sore throat, tinnitus, trouble swallowing and voice change.   Eyes: Negative for photophobia, pain, discharge, redness, itching and visual disturbance.  Respiratory: Negative for cough, choking, chest tightness, shortness of breath, wheezing and stridor.   Cardiovascular: Negative for chest pain, palpitations and leg swelling.  Gastrointestinal: Negative for abdominal distention, abdominal pain, blood in stool, constipation, diarrhea, nausea and vomiting.  Endocrine: Negative for cold intolerance and heat intolerance.  Genitourinary: Negative for difficulty urinating, dysuria and hematuria.  Musculoskeletal: Positive for neck pain. Negative for arthralgias, back pain, gait problem, joint swelling, myalgias and neck stiffness.  Skin: Negative for color change, pallor, rash and wound.  Allergic/Immunologic: Positive for environmental allergies. Negative for food allergies.  Neurological: Positive for headaches. Negative for dizziness, tremors, seizures, syncope, facial asymmetry, speech difficulty, weakness, light-headedness and numbness.  Hematological: Negative for adenopathy. Does not bruise/bleed easily.   Psychiatric/Behavioral: Negative for agitation, behavioral problems, confusion and sleep disturbance.       Objective:   Physical Exam  Constitutional: She is oriented to person, place, and time. She appears well-developed and well-nourished. She is active and cooperative.  Non-toxic appearance. She does not have a sickly appearance. She does not appear ill. No distress.  HENT:  Head: Normocephalic and atraumatic.  Right Ear: Hearing, external ear and ear canal normal. No drainage, swelling or tenderness. No foreign bodies. No mastoid tenderness. Tympanic membrane is injected, erythematous and bulging. Tympanic membrane is not scarred, not perforated and not retracted. A middle ear effusion is present. No hemotympanum. No decreased hearing is noted.  Left Ear: Hearing, external ear and ear canal normal. No drainage, swelling or tenderness. No foreign bodies. No mastoid tenderness. Tympanic membrane is bulging. Tympanic membrane is not injected, not scarred, not perforated, not erythematous and not retracted. A middle ear effusion is present. No hemotympanum. No decreased hearing is noted.  Nose: Mucosal edema and rhinorrhea present. No nose lacerations, sinus tenderness, nasal deformity, septal deviation or nasal septal hematoma. No epistaxis.  No foreign bodies. Right sinus exhibits no maxillary sinus tenderness and no frontal sinus tenderness. Left sinus exhibits no maxillary sinus tenderness and no frontal sinus tenderness.  Mouth/Throat: Uvula is midline and mucous membranes are normal. Mucous membranes are not pale, not dry and not cyanotic. She does not have dentures. No oral lesions. No trismus in the jaw. Normal dentition. No dental abscesses, uvula swelling, lacerations or dental caries. Posterior oropharyngeal edema and posterior oropharyngeal erythema present. No oropharyngeal exudate or tonsillar abscesses.  Cobblestoning posterior pharynx; bilateral TMs air fluid level and bulging; right  TM erythema/injection 90% and adjacent external auditory canal; bilateral allergic shiners  Eyes: Conjunctivae, EOM and lids are normal. Pupils are equal, round, and reactive to light. Right eye  exhibits no chemosis, no discharge, no exudate and no hordeolum. No foreign body present in the right eye. Left eye exhibits no chemosis, no discharge, no exudate and no hordeolum. No foreign body present in the left eye. Right conjunctiva is not injected. Right conjunctiva has no hemorrhage. Left conjunctiva is not injected. Left conjunctiva has no hemorrhage. No scleral icterus. Right eye exhibits normal extraocular motion and no nystagmus. Left eye exhibits normal extraocular motion and no nystagmus. Right pupil is round and reactive. Left pupil is round and reactive. Pupils are equal.  Neck: Trachea normal, normal range of motion and phonation normal. Neck supple. No tracheal tenderness, no spinous process tenderness and no muscular tenderness present. No neck rigidity. No tracheal deviation, no edema, no erythema and normal range of motion present. No thyroid mass and no thyromegaly present.    Cardiovascular: Normal rate, regular rhythm, S1 normal, S2 normal, normal heart sounds and intact distal pulses. PMI is not displaced. Exam reveals no gallop and no friction rub.  No murmur heard. Pulmonary/Chest: Effort normal and breath sounds normal. No accessory muscle usage or stridor. No respiratory distress. She has no decreased breath sounds. She has no wheezes. She has no rhonchi. She has no rales. She exhibits no tenderness.  Abdominal: Soft. Normal appearance. She exhibits no distension, no fluid wave and no ascites. There is no rigidity and no guarding.  Musculoskeletal: Normal range of motion. She exhibits no edema or tenderness.       Right shoulder: Normal.       Left shoulder: Normal.       Right elbow: Normal.      Left elbow: Normal.       Right hip: Normal.       Left hip: Normal.       Right  knee: Normal.       Left knee: Normal.       Cervical back: She exhibits pain. She exhibits normal range of motion, no tenderness, no bony tenderness, no swelling, no edema, no deformity, no laceration, no spasm and normal pulse.       Thoracic back: Normal.       Lumbar back: Normal.       Right hand: Normal.       Left hand: Normal.  Lymphadenopathy:       Head (right side): No submental, no submandibular, no tonsillar, no preauricular, no posterior auricular and no occipital adenopathy present.       Head (left side): No submental, no submandibular, no tonsillar, no preauricular, no posterior auricular and no occipital adenopathy present.    She has no cervical adenopathy.       Right cervical: No superficial cervical, no deep cervical and no posterior cervical adenopathy present.      Left cervical: No superficial cervical, no deep cervical and no posterior cervical adenopathy present.  Neurological: She is alert and oriented to person, place, and time. She has normal strength. She is not disoriented. She displays no atrophy and no tremor. No cranial nerve deficit or sensory deficit. She exhibits normal muscle tone. She displays no seizure activity. Coordination and gait normal. GCS eye subscore is 4. GCS verbal subscore is 5. GCS motor subscore is 6.  On/off exam table; in/out of chair without difficulty; gait sure and steady in hallway  Skin: Skin is warm, dry and intact. No abrasion, no bruising, no burn, no ecchymosis, no laceration, no lesion, no petechiae and no rash noted. She is not diaphoretic. No  cyanosis or erythema. No pallor. Nails show no clubbing.  Psychiatric: She has a normal mood and affect. Her speech is normal and behavior is normal. Judgment and thought content normal. Cognition and memory are normal.  Nursing note and vitals reviewed.         Assessment & Plan:  A- acute otitis media right, elevated blood pressure and obesity  P-Start Amoxicillin 875mg  po BID x 10  days #20 RF0 dispensed from [facility], tylenol 1000mg  po q6h prn pain 6 UD given from clinic stock to patient.  Supportive treatment.   No evidence of invasive bacterial infection, non toxic and well hydrated.  This is most likely self limiting viral infection.  I do not see where any further testing or imaging is necessary at this time.   I will suggest supportive care, rest, good hygiene and encourage the patient to take adequate fluids.  The patient is to return to clinic or EMERGENCY ROOM if symptoms worsen or change significantly e.g. ear pain, fever, purulent discharge from ears or bleeding.  Exitcare handout on otitis media given to patient.  Patient verbalized agreement and understanding of treatment plan and had no further questions at this time.  ER precautions follow up 2 weeks BP recheck with RN Nance PewHaley Workman.   Restart no added sodium diet and exercise program working up to 150 minutes per week.  Recommended weight loss/weight maintenance to BMI 20-25.  Return to the clinic if any new symptoms.  ER if chest pain, SOB, diaphoresis, worst headache of life, visual changes  Patient verbalized agreement and understanding of treatment plan and had no further questions at this time.   P2:  Diet and Exercise specific for HTN

## 2017-11-17 NOTE — Patient Instructions (Signed)
Otitis Media, Adult Otitis media occurs when there is inflammation and fluid in the middle ear. Your middle ear is a part of the ear that contains bones for hearing as well as air that helps send sounds to your brain. What are the causes? This condition is caused by a blockage in the eustachian tube. This tube drains fluid from the ear to the back of the nose (nasopharynx). A blockage in this tube can be caused by an object or by swelling (edema) in the tube. Problems that can cause a blockage include:  A cold or other upper respiratory infection.  Allergies.  An irritant, such as tobacco smoke.  Enlarged adenoids. The adenoids are areas of soft tissue located high in the back of the throat, behind the nose and the roof of the mouth.  A mass in the nasopharynx.  Damage to the ear caused by pressure changes (barotrauma). What are the signs or symptoms? Symptoms of this condition include:  Ear pain.  A fever.  Decreased hearing.  A headache.  Tiredness (lethargy).  Fluid leaking from the ear.  Ringing in the ear. How is this diagnosed? This condition is diagnosed with a physical exam. During the exam your health care provider will use an instrument called an otoscope to look into your ear and check for redness, swelling, and fluid. He or she will also ask about your symptoms. Your health care provider may also order tests, such as:  A test to check the movement of the eardrum (pneumatic otoscopy). This test is done by squeezing a small amount of air into the ear.  A test that changes air pressure in the middle ear to check how well the eardrum moves and whether the eustachian tube is working (tympanogram). How is this treated? This condition usually goes away on its own within 3-5 days. But if the condition is caused by a bacteria infection and does not go away own its own, or keeps coming back, your health care provider may:  Prescribe antibiotic medicines to treat the  infection.  Prescribe or recommend medicines to control pain. Follow these instructions at home:  Take over-the-counter and prescription medicines only as told by your health care provider.  If you were prescribed an antibiotic medicine, take it as told by your health care provider. Do not stop taking the antibiotic even if you start to feel better.  Keep all follow-up visits as told by your health care provider. This is important. Contact a health care provider if:  You have bleeding from your nose.  There is a lump on your neck.  You are not getting better in 5 days.  You feel worse instead of better. Get help right away if:  You have severe pain that is not controlled with medicine.  You have swelling, redness, or pain around your ear.  You have stiffness in your neck.  A part of your face is paralyzed.  The bone behind your ear (mastoid) is tender when you touch it.  You develop a severe headache. Summary  Otitis media is redness, soreness, and swelling of the middle ear.  This condition usually goes away on its own within 3-5 days.  If the problem does not go away in 3-5 days, your health care provider may prescribe or recommend medicines to treat your symptoms.  If you were prescribed an antibiotic medicine, take it as told by your health care provider. This information is not intended to replace advice given to you by your   to you by your health care provider. Make sure you discuss any questions you have with your health care provider. Document Released: 09/12/2004 Document Revised: 11/28/2016 Document Reviewed: 11/28/2016 Elsevier Interactive Patient Education  2017 [employer] Hypertension Hypertension, commonly called high blood pressure, is when the force of blood pumping through the arteries is too strong. The arteries are the blood vessels that carry blood from the heart throughout the body. Hypertension forces the heart to work harder to pump blood and may cause  arteries to become narrow or stiff. Having untreated or uncontrolled hypertension can cause heart attacks, strokes, kidney disease, and other problems. A blood pressure reading consists of a higher number over a lower number. Ideally, your blood pressure should be below 120/80. The first ("top") number is called the systolic pressure. It is a measure of the pressure in your arteries as your heart beats. The second ("bottom") number is called the diastolic pressure. It is a measure of the pressure in your arteries as the heart relaxes. What are the causes? The cause of this condition is not known. What increases the risk? Some risk factors for high blood pressure are under your control. Others are not. Factors you can change  Smoking.  Having type 2 diabetes mellitus, high cholesterol, or both.  Not getting enough exercise or physical activity.  Being overweight.  Having too much fat, sugar, calories, or salt (sodium) in your diet.  Drinking too much alcohol. Factors that are difficult or impossible to change  Having chronic kidney disease.  Having a family history of high blood pressure.  Age. Risk increases with age.  Race. You may be at higher risk if you are African-American.  Gender. Men are at higher risk than women before age 41. After age 41, women are at higher risk than men.  Having obstructive sleep apnea.  Stress. What are the signs or symptoms? Extremely high blood pressure (hypertensive crisis) may cause:  Headache.  Anxiety.  Shortness of breath.  Nosebleed.  Nausea and vomiting.  Severe chest pain.  Jerky movements you cannot control (seizures).  How is this diagnosed? This condition is diagnosed by measuring your blood pressure while you are seated, with your arm resting on a surface. The cuff of the blood pressure monitor will be placed directly against the skin of your upper arm at the level of your heart. It should be measured at least twice  using the same arm. Certain conditions can cause a difference in blood pressure between your right and left arms. Certain factors can cause blood pressure readings to be lower or higher than normal (elevated) for a short period of time:  When your blood pressure is higher when you are in a health care provider's office than when you are at home, this is called white coat hypertension. Most people with this condition do not need medicines.  When your blood pressure is higher at home than when you are in a health care provider's office, this is called masked hypertension. Most people with this condition may need medicines to control blood pressure.  If you have a high blood pressure reading during one visit or you have normal blood pressure with other risk factors:  You may be asked to return on a different day to have your blood pressure checked again.  You may be asked to monitor your blood pressure at home for 1 week or longer.  If you are diagnosed with hypertension, you may have other blood or imaging tests to  help your health care provider understand your overall risk for other conditions. How is this treated? This condition is treated by making healthy lifestyle changes, such as eating healthy foods, exercising more, and reducing your alcohol intake. Your health care provider may prescribe medicine if lifestyle changes are not enough to get your blood pressure under control, and if:  Your systolic blood pressure is above 130.  Your diastolic blood pressure is above 80.  Your personal target blood pressure may vary depending on your medical conditions, your age, and other factors. Follow these instructions at home: Eating and drinking  Eat a diet that is high in fiber and potassium, and low in sodium, added sugar, and fat. An example eating plan is called the DASH (Dietary Approaches to Stop Hypertension) diet. To eat this way: ? Eat plenty of fresh fruits and vegetables. Try to fill  half of your plate at each meal with fruits and vegetables. ? Eat whole grains, such as whole wheat pasta, brown rice, or whole grain bread. Fill about one quarter of your plate with whole grains. ? Eat or drink low-fat dairy products, such as skim milk or low-fat yogurt. ? Avoid fatty cuts of meat, processed or cured meats, and poultry with skin. Fill about one quarter of your plate with lean proteins, such as fish, chicken without skin, beans, eggs, and tofu. ? Avoid premade and processed foods. These tend to be higher in sodium, added sugar, and fat.  Reduce your daily sodium intake. Most people with hypertension should eat less than 1,500 mg of sodium a day.  Limit alcohol intake to no more than 1 drink a day for nonpregnant women and 2 drinks a day for men. One drink equals 12 oz of beer, 5 oz of wine, or 1 oz of hard liquor. Lifestyle  Work with your health care provider to maintain a healthy body weight or to lose weight. Ask what an ideal weight is for you.  Get at least 30 minutes of exercise that causes your heart to beat faster (aerobic exercise) most days of the week. Activities may include walking, swimming, or biking.  Include exercise to strengthen your muscles (resistance exercise), such as pilates or lifting weights, as part of your weekly exercise routine. Try to do these types of exercises for 30 minutes at least 3 days a week.  Do not use any products that contain nicotine or tobacco, such as cigarettes and e-cigarettes. If you need help quitting, ask your health care provider.  Monitor your blood pressure at home as told by your health care provider.  Keep all follow-up visits as told by your health care provider. This is important. Medicines  Take over-the-counter and prescription medicines only as told by your health care provider. Follow directions carefully. Blood pressure medicines must be taken as prescribed.  Do not skip doses of blood pressure medicine. Doing  this puts you at risk for problems and can make the medicine less effective.  Ask your health care provider about side effects or reactions to medicines that you should watch for. Contact a health care provider if:  You think you are having a reaction to a medicine you are taking.  You have headaches that keep coming back (recurring).  You feel dizzy.  You have swelling in your ankles.  You have trouble with your vision. Get help right away if:  You develop a severe headache or confusion.  You have unusual weakness or numbness.  You feel faint.  You  have severe pain in your chest or abdomen.  You vomit repeatedly.  You have trouble breathing. Summary  Hypertension is when the force of blood pumping through your arteries is too strong. If this condition is not controlled, it may put you at risk for serious complications.  Your personal target blood pressure may vary depending on your medical conditions, your age, and other factors. For most people, a normal blood pressure is less than 120/80.  Hypertension is treated with lifestyle changes, medicines, or a combination of both. Lifestyle changes include weight loss, eating a healthy, low-sodium diet, exercising more, and limiting alcohol. This information is not intended to replace advice given to you by your health care provider. Make sure you discuss any questions you have with your health care provider. Document Released: 12/08/2005 Document Revised: 11/05/2016 Document Reviewed: 11/05/2016 Elsevier Interactive Patient Education  [employer].

## 2018-03-16 ENCOUNTER — Ambulatory Visit: Payer: Self-pay | Admitting: Registered Nurse

## 2018-03-16 VITALS — BP 119/102 | HR 78 | Temp 99.0°F

## 2018-03-16 DIAGNOSIS — J0101 Acute recurrent maxillary sinusitis: Secondary | ICD-10-CM

## 2018-03-16 DIAGNOSIS — R030 Elevated blood-pressure reading, without diagnosis of hypertension: Secondary | ICD-10-CM

## 2018-03-16 DIAGNOSIS — H6593 Unspecified nonsuppurative otitis media, bilateral: Secondary | ICD-10-CM

## 2018-03-16 MED ORDER — AMOXICILLIN-POT CLAVULANATE 875-125 MG PO TABS: 1.0000 | ORAL_TABLET | Freq: Two times a day (BID) | ORAL | 0 refills | Status: AC

## 2018-03-16 MED ORDER — SALINE SPRAY 0.65 % NA SOLN
2.0000 | NASAL | 0 refills | Status: DC
Start: 2018-03-16 — End: 2018-09-28

## 2018-03-16 MED ORDER — MELOXICAM 15 MG PO TABS: 15.0000 mg | ORAL_TABLET | Freq: Every day | ORAL | 0 refills | Status: AC

## 2018-03-16 MED ORDER — FLUTICASONE PROPIONATE 50 MCG/ACT NA SUSP: 1.0000 | Freq: Two times a day (BID) | NASAL | 6 refills | Status: DC | PRN

## 2018-03-16 MED ORDER — AMOXICILLIN-POT CLAVULANATE 875-125 MG PO TABS: 1.0000 | ORAL_TABLET | Freq: Two times a day (BID) | ORAL | 0 refills | Status: DC

## 2018-03-16 NOTE — Progress Notes (Signed)
Subjective:    Patient ID: Shari Shields, female    DOB: 29-Jan-1976, 42 y.o.   MRN: [id number]  41y/o african american female established Pt reports frontal sinus pressure x3 weeks. Denies pressure eyes. Yesterday began having pain/pressure L ear. +Productive cough, sore throat. Denies fever at home. Has been using Flonase, Claritin, Dayquil/Nyquil, AlkaSeltzer Cold, hot tea/honey/lemon at home with mild temporary relief. + sick contacts at home and work     Review of Systems  Constitutional: Negative for activity change, appetite change, chills, diaphoresis, fatigue, fever and unexpected weight change.  HENT: Positive for congestion, ear pain, postnasal drip, sinus pressure and sore throat. Negative for dental problem, drooling, ear discharge, facial swelling, hearing loss, mouth sores, nosebleeds, rhinorrhea, sinus pain, sneezing, tinnitus, trouble swallowing and voice change.   Eyes: Negative for photophobia, pain, discharge, redness, itching and visual disturbance.  Respiratory: Positive for cough. Negative for choking, chest tightness, shortness of breath, wheezing and stridor.   Cardiovascular: Negative for chest pain, palpitations and leg swelling.  Gastrointestinal: Negative for abdominal distention, abdominal pain, blood in stool, constipation, diarrhea, nausea and vomiting.  Endocrine: Negative for cold intolerance and heat intolerance.  Genitourinary: Negative for difficulty urinating, dysuria and hematuria.  Musculoskeletal: Negative for arthralgias, back pain, gait problem, joint swelling, myalgias, neck pain and neck stiffness.  Skin: Negative for color change, pallor, rash and wound.  Allergic/Immunologic: Positive for environmental allergies. Negative for food allergies.  Neurological: Positive for headaches. Negative for dizziness, tremors, seizures, syncope, facial asymmetry, speech difficulty, weakness, light-headedness and numbness.  Hematological: Negative for  adenopathy. Does not bruise/bleed easily.  Psychiatric/Behavioral: Negative for agitation, behavioral problems, confusion and sleep disturbance.       Objective:   Physical Exam  Constitutional: She is oriented to person, place, and time. She appears well-developed and well-nourished. She is active and cooperative.  Non-toxic appearance. She does not have a sickly appearance. She appears ill. No distress.  HENT:  Head: Normocephalic and atraumatic.  Right Ear: Hearing, external ear and ear canal normal. Tympanic membrane is erythematous and bulging. A middle ear effusion is present.  Left Ear: Hearing, external ear and ear canal normal. Tympanic membrane is erythematous and bulging. A middle ear effusion is present.  Nose: Mucosal edema and rhinorrhea present. No nose lacerations, sinus tenderness, nasal deformity, septal deviation or nasal septal hematoma. No epistaxis.  No foreign bodies. Right sinus exhibits maxillary sinus tenderness. Right sinus exhibits no frontal sinus tenderness. Left sinus exhibits maxillary sinus tenderness. Left sinus exhibits no frontal sinus tenderness.  Mouth/Throat: Uvula is midline and mucous membranes are normal. Mucous membranes are not pale, not dry and not cyanotic. No oral lesions. No trismus in the jaw. Normal dentition. No dental abscesses, uvula swelling, lacerations or dental caries. Posterior oropharyngeal edema and posterior oropharyngeal erythema present. No oropharyngeal exudate or tonsillar abscesses.  Right greater than left TM erythema bulging air fluid level; maxillary sinuses bilaterally TTP; cobblestoning posterior pharynx; bilateral allergic shiners; nasal turbinates edema/erythema clear discharge  Eyes: Pupils are equal, round, and reactive to light. Conjunctivae, EOM and lids are normal. Right eye exhibits no chemosis, no discharge, no exudate and no hordeolum. No foreign body present in the right eye. Left eye exhibits no chemosis, no discharge,  no exudate and no hordeolum. No foreign body present in the left eye. Right conjunctiva is not injected. Right conjunctiva has no hemorrhage. Left conjunctiva is not injected. Left conjunctiva has no hemorrhage. No scleral icterus. Right eye exhibits normal extraocular  motion and no nystagmus. Left eye exhibits normal extraocular motion and no nystagmus. Right pupil is round and reactive. Left pupil is round and reactive. Pupils are equal.  Neck: Trachea normal, normal range of motion and phonation normal. Neck supple. No tracheal tenderness and no muscular tenderness present. No neck rigidity. No tracheal deviation, no edema, no erythema and normal range of motion present. No thyroid mass and no thyromegaly present.  Cardiovascular: Normal rate, regular rhythm, S1 normal, S2 normal and intact distal pulses. PMI is not displaced. Exam reveals no gallop, no distant heart sounds and no friction rub.  No murmur heard. Pulmonary/Chest: Effort normal and breath sounds normal. No accessory muscle usage or stridor. No respiratory distress. She has no decreased breath sounds. She has no wheezes. She has no rhonchi. She has no rales. She exhibits no tenderness.  No cough observed in exam room; spoke full sentences without difficulty  Abdominal: Soft. Normal appearance. She exhibits no distension, no fluid wave and no ascites. There is no rigidity and no guarding.  Musculoskeletal: Normal range of motion. She exhibits no edema, tenderness or deformity.       Right shoulder: Normal.       Left shoulder: Normal.       Right elbow: Normal.      Left elbow: Normal.       Right hip: Normal.       Left hip: Normal.       Right knee: Normal.       Left knee: Normal.       Cervical back: Normal.       Thoracic back: Normal.       Lumbar back: Normal.       Right hand: Normal.       Left hand: Normal.  Lymphadenopathy:       Head (right side): No submental, no submandibular, no tonsillar, no preauricular, no  posterior auricular and no occipital adenopathy present.       Head (left side): No submental, no submandibular, no tonsillar, no preauricular, no posterior auricular and no occipital adenopathy present.    She has no cervical adenopathy.       Right cervical: No superficial cervical, no deep cervical and no posterior cervical adenopathy present.      Left cervical: No superficial cervical, no deep cervical and no posterior cervical adenopathy present.  Neurological: She is alert and oriented to person, place, and time. She has normal strength. She is not disoriented. She displays no atrophy and no tremor. No cranial nerve deficit or sensory deficit. She exhibits normal muscle tone. She displays no seizure activity. Coordination and gait normal. GCS eye subscore is 4. GCS verbal subscore is 5. GCS motor subscore is 6.  On/off exam table; in/out of chair without difficulty; gait sure and steady in hallway  Skin: Skin is warm, dry and intact. No abrasion, no bruising, no burn, no ecchymosis, no laceration, no lesion, no petechiae and no rash noted. She is not diaphoretic. No cyanosis or erythema. No pallor. Nails show no clubbing.  Psychiatric: She has a normal mood and affect. Her speech is normal and behavior is normal. Judgment and thought content normal. Cognition and memory are normal.  Nursing note and vitals reviewed.         Assessment & Plan:  A-recurrent maxillary sinusitis acute; bilateral otitis media acute nonsupportive; elevated blood pressure  P-follow up with RN Rolly Salter for repeat blood pressure when off OTC cough and cold medicine.  Consider stopping OTC medications except plain tylenol, daily dose or zyrtec or claritin, flonase and nasal saline.   ER if chest pain, dyspnea, worst headache of life and/or visual changes for re-evaluation patient verbalized understanding information/instructions, agreed with plan of care and had no further questions at this time.  continue flonase 1  spray each nostril BID#1 RF6 electronic Rx to her pharmacy of choice, saline 2 sprays each nostril q2h wa prn congestion given 1 bottle from clinic stock.  If no improvement with 48 hours of saline and flonase use start augmentin 875mg  po BID x 10 days #20 RF0.  Electronic Rx given to her pharmacy of choice as out of stock [facility] formulary at this time.  Denied personal or family history of ENT cancer.  Shower BID especially prior to bed. No evidence of systemic bacterial infection, non toxic and well hydrated.  I do not see where any further testing or imaging is necessary at this time.   I will suggest supportive care, rest, good hygiene and encourage the patient to take adequate fluids.  The patient is to return to clinic or EMERGENCY ROOM if symptoms worsen or change significantly.  Exitcare handout on sinusitis and sinus rinse given to patient.  Patient verbalized agreement and understanding of treatment plan and had no further questions at this time.    Start augmentin 875mg  po BID x 10 days #20 RF0 electronic Rx to her pharmacy of choice.  Tylenol 1000mg  po QID prn pain.  Ensure continuing her antihistamine OTC of choice daily.  Supportive treatment.   No evidence of invasive bacterial infection, non toxic and well hydrated.  This is most likely self limiting viral infection.  I do not see where any further testing or imaging is necessary at this time.   I will suggest supportive care, rest, good hygiene and encourage the patient to take adequate fluids.  The patient is to return to clinic or EMERGENCY ROOM if symptoms worsen or change significantly e.g. ear pain, fever, purulent discharge from ears or bleeding.  Exitcare handout on otitis media with effusion given to patient.  Patient verbalized agreement and understanding of treatment plan.   P2:  Hand washing and cover cough  Discussed viral and allergic rhinitis probably the start of her sinus inflammation and fluid back up into ears.  Post nasal drip  causing throat irritation/swelling then eustachian tube dysfunction and cough.  Patient verbalized understanding information and had no further questions at this time.

## 2018-03-17 NOTE — Patient Instructions (Signed)
Hypertension Hypertension, commonly called high blood pressure, is when the force of blood pumping through the arteries is too strong. The arteries are the blood vessels that carry blood from the heart throughout the body. Hypertension forces the heart to work harder to pump blood and may cause arteries to become narrow or stiff. Having untreated or uncontrolled hypertension can cause heart attacks, strokes, kidney disease, and other problems. A blood pressure reading consists of a higher number over a lower number. Ideally, your blood pressure should be below 120/80. The first ("top") number is called the systolic pressure. It is a measure of the pressure in your arteries as your heart beats. The second ("bottom") number is called the diastolic pressure. It is a measure of the pressure in your arteries as the heart relaxes. What are the causes? The cause of this condition is not known. What increases the risk? Some risk factors for high blood pressure are under your control. Others are not. Factors you can change  Smoking.  Having type 2 diabetes mellitus, high cholesterol, or both.  Not getting enough exercise or physical activity.  Being overweight.  Having too much fat, sugar, calories, or salt (sodium) in your diet.  Drinking too much alcohol. Factors that are difficult or impossible to change  Having chronic kidney disease.  Having a family history of high blood pressure.  Age. Risk increases with age.  Race. You may be at higher risk if you are African-American.  Gender. Men are at higher risk than women before age 45. After age 65, women are at higher risk than men.  Having obstructive sleep apnea.  Stress. What are the signs or symptoms? Extremely high blood pressure (hypertensive crisis) may cause:  Headache.  Anxiety.  Shortness of breath.  Nosebleed.  Nausea and vomiting.  Severe chest pain.  Jerky movements you cannot control (seizures).  How is this  diagnosed? This condition is diagnosed by measuring your blood pressure while you are seated, with your arm resting on a surface. The cuff of the blood pressure monitor will be placed directly against the skin of your upper arm at the level of your heart. It should be measured at least twice using the same arm. Certain conditions can cause a difference in blood pressure between your right and left arms. Certain factors can cause blood pressure readings to be lower or higher than normal (elevated) for a short period of time:  When your blood pressure is higher when you are in a health care provider's office than when you are at home, this is called white coat hypertension. Most people with this condition do not need medicines.  When your blood pressure is higher at home than when you are in a health care provider's office, this is called masked hypertension. Most people with this condition may need medicines to control blood pressure.  If you have a high blood pressure reading during one visit or you have normal blood pressure with other risk factors:  You may be asked to return on a different day to have your blood pressure checked again.  You may be asked to monitor your blood pressure at home for 1 week or longer.  If you are diagnosed with hypertension, you may have other blood or imaging tests to help your health care provider understand your overall risk for other conditions. How is this treated? This condition is treated by making healthy lifestyle changes, such as eating healthy foods, exercising more, and reducing your alcohol intake. Your   health care provider may prescribe medicine if lifestyle changes are not enough to get your blood pressure under control, and if:  Your systolic blood pressure is above 130.  Your diastolic blood pressure is above 80.  Your personal target blood pressure may vary depending on your medical conditions, your age, and other factors. Follow these  instructions at home: Eating and drinking  Eat a diet that is high in fiber and potassium, and low in sodium, added sugar, and fat. An example eating plan is called the DASH (Dietary Approaches to Stop Hypertension) diet. To eat this way: ? Eat plenty of fresh fruits and vegetables. Try to fill half of your plate at each meal with fruits and vegetables. ? Eat whole grains, such as whole wheat pasta, brown rice, or whole grain bread. Fill about one quarter of your plate with whole grains. ? Eat or drink low-fat dairy products, such as skim milk or low-fat yogurt. ? Avoid fatty cuts of meat, processed or cured meats, and poultry with skin. Fill about one quarter of your plate with lean proteins, such as fish, chicken without skin, beans, eggs, and tofu. ? Avoid premade and processed foods. These tend to be higher in sodium, added sugar, and fat.  Reduce your daily sodium intake. Most people with hypertension should eat less than 1,500 mg of sodium a day.  Limit alcohol intake to no more than 1 drink a day for nonpregnant women and 2 drinks a day for men. One drink equals 12 oz of beer, 5 oz of wine, or 1 oz of hard liquor. Lifestyle  Work with your health care provider to maintain a healthy body weight or to lose weight. Ask what an ideal weight is for you.  Get at least 30 minutes of exercise that causes your heart to beat faster (aerobic exercise) most days of the week. Activities may include walking, swimming, or biking.  Include exercise to strengthen your muscles (resistance exercise), such as pilates or lifting weights, as part of your weekly exercise routine. Try to do these types of exercises for 30 minutes at least 3 days a week.  Do not use any products that contain nicotine or tobacco, such as cigarettes and e-cigarettes. If you need help quitting, ask your health care provider.  Monitor your blood pressure at home as told by your health care provider.  Keep all follow-up visits as  told by your health care provider. This is important. Medicines  Take over-the-counter and prescription medicines only as told by your health care provider. Follow directions carefully. Blood pressure medicines must be taken as prescribed.  Do not skip doses of blood pressure medicine. Doing this puts you at risk for problems and can make the medicine less effective.  Ask your health care provider about side effects or reactions to medicines that you should watch for. Contact a health care provider if:  You think you are having a reaction to a medicine you are taking.  You have headaches that keep coming back (recurring).  You feel dizzy.  You have swelling in your ankles.  You have trouble with your vision. Get help right away if:  You develop a severe headache or confusion.  You have unusual weakness or numbness.  You feel faint.  You have severe pain in your chest or abdomen.  You vomit repeatedly.  You have trouble breathing. Summary  Hypertension is when the force of blood pumping through your arteries is too strong. If this condition is not   controlled, it may put you at risk for serious complications.  Your personal target blood pressure may vary depending on your medical conditions, your age, and other factors. For most people, a normal blood pressure is less than 120/80.  Hypertension is treated with lifestyle changes, medicines, or a combination of both. Lifestyle changes include weight loss, eating a healthy, low-sodium diet, exercising more, and limiting alcohol. This information is not intended to replace advice given to you by your health care provider. Make sure you discuss any questions you have with your health care provider. Document Released: 12/08/2005 Document Revised: 11/05/2016 Document Reviewed: 11/05/2016 Elsevier Interactive Patient Education  2018 [employer] Sinus Rinse What is a sinus rinse? A sinus rinse is a simple home treatment that is  used to rinse your sinuses with a sterile mixture of salt and water (saline solution). Sinuses are air-filled spaces in your skull behind the bones of your face and forehead that open into your nasal cavity. You will use the following:  Saline solution.  Neti pot or spray bottle. This releases the saline solution into your nose and through your sinuses. Neti pots and spray bottles can be purchased at [employer], a health food store, or online.  When would I do a sinus rinse? A sinus rinse can help to clear mucus, dirt, dust, or pollen from the nasal cavity. You may do a sinus rinse when you have a cold, a virus, nasal allergy symptoms, a sinus infection, or stuffiness in the nose or sinuses. If you are considering a sinus rinse:  Ask your child's health care provider before performing a sinus rinse on your child.  Do not do a sinus rinse if you have had ear or nasal surgery, ear infection, or blocked ears.  How do I do a sinus rinse?  Wash your hands.  Disinfect your device according to the directions provided and then dry it.  Use the solution that comes with your device or one that is sold separately in stores. Follow the mixing directions on the package.  Fill your device with the amount of saline solution as directed by the device instructions.  Stand over a sink and tilt your head sideways over the sink.  Place the spout of the device in your upper nostril (the one closer to the ceiling).  Gently pour or squeeze the saline solution into the nasal cavity. The liquid should drain to the lower nostril if you are not overly congested.  Gently blow your nose. Blowing too hard may cause ear pain.  Repeat in the other nostril.  Clean and rinse your device with clean water and then air-dry it. Are there risks of a sinus rinse? Sinus rinse is generally very safe and effective. However, there are a few risks, which include:  A burning sensation in the sinuses. This may  happen if you do not make the saline solution as directed. Make sure to follow all directions when making the saline solution.  Infection from contaminated water. This is rare, but possible.  Nasal irritation.  This information is not intended to replace advice given to you by your health care provider. Make sure you discuss any questions you have with your health care provider. Document Released: 07/05/2014 Document Revised: 11/04/2016 Document Reviewed: 04/25/2014 Elsevier Interactive Patient Education  2017 [employer] Sinusitis, Adult Sinusitis is soreness and inflammation of your sinuses. Sinuses are hollow spaces in the bones around your face. Your sinuses are located:  Around your eyes.  In  the middle of your forehead.  Behind your nose.  In your cheekbones.  Your sinuses and nasal passages are lined with a stringy fluid (mucus). Mucus normally drains out of your sinuses. When your nasal tissues become inflamed or swollen, the mucus can become trapped or blocked so air cannot flow through your sinuses. This allows bacteria, viruses, and funguses to grow, which leads to infection. Sinusitis can develop quickly and last for 7?10 days (acute) or for more than 12 weeks (chronic). Sinusitis often develops after a cold. What are the causes? This condition is caused by anything that creates swelling in the sinuses or stops mucus from draining, including:  Allergies.  Asthma.  Bacterial or viral infection.  Abnormally shaped bones between the nasal passages.  Nasal growths that contain mucus (nasal polyps).  Narrow sinus openings.  Pollutants, such as chemicals or irritants in the air.  A foreign object stuck in the nose.  A fungal infection. This is rare.  What increases the risk? The following factors may make you more likely to develop this condition:  Having allergies or asthma.  Having had a recent cold or respiratory tract infection.  Having structural  deformities or blockages in your nose or sinuses.  Having a weak immune system.  Doing a lot of swimming or diving.  Overusing nasal sprays.  Smoking.  What are the signs or symptoms? The main symptoms of this condition are pain and a feeling of pressure around the affected sinuses. Other symptoms include:  Upper toothache.  Earache.  Headache.  Bad breath.  Decreased sense of smell and taste.  A cough that may get worse at night.  Fatigue.  Fever.  Thick drainage from your nose. The drainage is often green and it may contain pus (purulent).  Stuffy nose or congestion.  Postnasal drip. This is when extra mucus collects in the throat or back of the nose.  Swelling and warmth over the affected sinuses.  Sore throat.  Sensitivity to light.  How is this diagnosed? This condition is diagnosed based on symptoms, a medical history, and a physical exam. To find out if your condition is acute or chronic, your health care provider may:  Look in your nose for signs of nasal polyps.  Tap over the affected sinus to check for signs of infection.  View the inside of your sinuses using an imaging device that has a light attached (endoscope).  If your health care provider suspects that you have chronic sinusitis, you may also:  Be tested for allergies.  Have a sample of mucus taken from your nose (nasal culture) and checked for bacteria.  Have a mucus sample examined to see if your sinusitis is related to an allergy.  If your sinusitis does not respond to treatment and it lasts longer than 8 weeks, you may have an MRI or CT scan to check your sinuses. These scans also help to determine how severe your infection is. In rare cases, a bone biopsy may be done to rule out more serious types of fungal sinus disease. How is this treated? Treatment for sinusitis depends on the cause and whether your condition is chronic or acute. If a virus is causing your sinusitis, your symptoms  will go away on their own within 10 days. You may be given medicines to relieve your symptoms, including:  Topical nasal decongestants. They shrink swollen nasal passages and let mucus drain from your sinuses.  Antihistamines. These drugs block inflammation that is triggered by allergies. This  can help to ease swelling in your nose and sinuses.  Topical nasal corticosteroids. These are nasal sprays that ease inflammation and swelling in your nose and sinuses.  Nasal saline washes. These rinses can help to get rid of thick mucus in your nose.  If your condition is caused by bacteria, you will be given an antibiotic medicine. If your condition is caused by a fungus, you will be given an antifungal medicine. Surgery may be needed to correct underlying conditions, such as narrow nasal passages. Surgery may also be needed to remove polyps. Follow these instructions at home: Medicines  Take, use, or apply over-the-counter and prescription medicines only as told by your health care provider. These may include nasal sprays.  If you were prescribed an antibiotic medicine, take it as told by your health care provider. Do not stop taking the antibiotic even if you start to feel better. Hydrate and Humidify  Drink enough water to keep your urine clear or pale yellow. Staying hydrated will help to thin your mucus.  Use a cool mist humidifier to keep the humidity level in your home above 50%.  Inhale steam for 10-15 minutes, 3-4 times a day or as told by your health care provider. You can do this in the bathroom while a hot shower is running.  Limit your exposure to cool or dry air. Rest  Rest as much as possible.  Sleep with your head raised (elevated).  Make sure to get enough sleep each night. General instructions  Apply a warm, moist washcloth to your face 3-4 times a day or as told by your health care provider. This will help with discomfort.  Wash your hands often with soap and water to  reduce your exposure to viruses and other germs. If soap and water are not available, use hand sanitizer.  Do not smoke. Avoid being around people who are smoking (secondhand smoke).  Keep all follow-up visits as told by your health care provider. This is important. Contact a health care provider if:  You have a fever.  Your symptoms get worse.  Your symptoms do not improve within 10 days. Get help right away if:  You have a severe headache.  You have persistent vomiting.  You have pain or swelling around your face or eyes.  You have vision problems.  You develop confusion.  Your neck is stiff.  You have trouble breathing. This information is not intended to replace advice given to you by your health care provider. Make sure you discuss any questions you have with your health care provider. Document Released: 12/08/2005 Document Revised: 08/03/2016 Document Reviewed: 10/03/2015 Elsevier Interactive Patient Education  2018 [employer]. Otitis Media, Adult Otitis media occurs when there is inflammation and fluid in the middle ear. Your middle ear is a part of the ear that contains bones for hearing as well as air that helps send sounds to your brain. What are the causes? This condition is caused by a blockage in the eustachian tube. This tube drains fluid from the ear to the back of the nose (nasopharynx). A blockage in this tube can be caused by an object or by swelling (edema) in the tube. Problems that can cause a blockage include:  A cold or other upper respiratory infection.  Allergies.  An irritant, such as tobacco smoke.  Enlarged adenoids. The adenoids are areas of soft tissue located high in the back of the throat, behind the nose and the roof of the mouth.  A mass  in the nasopharynx.  Damage to the ear caused by pressure changes (barotrauma).  What are the signs or symptoms? Symptoms of this condition include:  Ear pain.  A fever.  Decreased  hearing.  A headache.  Tiredness (lethargy).  Fluid leaking from the ear.  Ringing in the ear.  How is this diagnosed? This condition is diagnosed with a physical exam. During the exam your health care provider will use an instrument called an otoscope to look into your ear and check for redness, swelling, and fluid. He or she will also ask about your symptoms. Your health care provider may also order tests, such as:  A test to check the movement of the eardrum (pneumatic otoscopy). This test is done by squeezing a small amount of air into the ear.  A test that changes air pressure in the middle ear to check how well the eardrum moves and whether the eustachian tube is working (tympanogram).  How is this treated? This condition usually goes away on its own within 3-5 days. But if the condition is caused by a bacteria infection and does not go away own its own, or keeps coming back, your health care provider may:  Prescribe antibiotic medicines to treat the infection.  Prescribe or recommend medicines to control pain.  Follow these instructions at home:  Take over-the-counter and prescription medicines only as told by your health care provider.  If you were prescribed an antibiotic medicine, take it as told by your health care provider. Do not stop taking the antibiotic even if you start to feel better.  Keep all follow-up visits as told by your health care provider. This is important. Contact a health care provider if:  You have bleeding from your nose.  There is a lump on your neck.  You are not getting better in 5 days.  You feel worse instead of better. Get help right away if:  You have severe pain that is not controlled with medicine.  You have swelling, redness, or pain around your ear.  You have stiffness in your neck.  A part of your face is paralyzed.  The bone behind your ear (mastoid) is tender when you touch it.  You develop a severe  headache. Summary  Otitis media is redness, soreness, and swelling of the middle ear.  This condition usually goes away on its own within 3-5 days.  If the problem does not go away in 3-5 days, your health care provider may prescribe or recommend medicines to treat your symptoms.  If you were prescribed an antibiotic medicine, take it as told by your health care provider. This information is not intended to replace advice given to you by your health care provider. Make sure you discuss any questions you have with your health care provider. Document Released: 09/12/2004 Document Revised: 11/28/2016 Document Reviewed: 11/28/2016 Elsevier Interactive Patient Education  [employer].

## 2018-09-28 ENCOUNTER — Ambulatory Visit: Payer: Self-pay | Admitting: Registered Nurse

## 2018-09-28 ENCOUNTER — Encounter: Payer: Self-pay | Admitting: Registered Nurse

## 2018-09-28 VITALS — BP 122/64 | HR 74 | Temp 99.2°F

## 2018-09-28 DIAGNOSIS — J301 Allergic rhinitis due to pollen: Secondary | ICD-10-CM

## 2018-09-28 DIAGNOSIS — H65193 Other acute nonsuppurative otitis media, bilateral: Secondary | ICD-10-CM

## 2018-09-28 MED ORDER — FLUTICASONE PROPIONATE 50 MCG/ACT NA SUSP
1.0000 | Freq: Two times a day (BID) | NASAL | 6 refills | Status: DC
Start: 2018-09-28 — End: 2019-03-17

## 2018-09-28 MED ORDER — LORATADINE 10 MG PO TABS: 10.0000 mg | ORAL_TABLET | Freq: Every day | ORAL | 3 refills | Status: DC

## 2018-09-28 MED ORDER — AMOXICILLIN-POT CLAVULANATE 875-125 MG PO TABS: 1.0000 | ORAL_TABLET | Freq: Two times a day (BID) | ORAL | 0 refills | Status: AC

## 2018-09-28 MED ORDER — SALINE SPRAY 0.65 % NA SOLN: 2.0000 | NASAL | 0 refills | Status: DC

## 2018-09-28 MED ORDER — ACETAMINOPHEN 500 MG PO TABS: 1000.0000 mg | ORAL_TABLET | Freq: Four times a day (QID) | ORAL | 0 refills | Status: AC | PRN

## 2018-09-28 NOTE — Progress Notes (Signed)
Subjective:    Patient ID: Ferne Coe, female    DOB: Jan 06, 1976, 42 y.o.   MRN: [id number]  41y/o African-American established female pt c/o bilateral otalgia x1 week. L>R. Dull achy pain. Denies drainage, hearing changes, eustachian tube discomfort. +Frontal HA. Denies rhinorrhea, nasal congestion, sore throat, cough. Not using any OTCs at home for sx.  Last ear infection approximately 1 year ago amoxicillin worked well Nov 2018  + sick contacts at work and history of seasonal allergies does not think she is currently having a flare  Taking meloxican and naproxen po prn knee pain     Review of Systems  Constitutional: Negative for activity change, appetite change, chills, diaphoresis, fatigue, fever and unexpected weight change.  HENT: Positive for ear pain. Negative for congestion, dental problem, drooling, ear discharge, facial swelling, hearing loss, mouth sores, nosebleeds, postnasal drip, rhinorrhea, sinus pressure, sinus pain, sneezing, sore throat, tinnitus, trouble swallowing and voice change.   Eyes: Negative for photophobia, pain, discharge, redness, itching and visual disturbance.  Respiratory: Negative for cough, choking, chest tightness, shortness of breath, wheezing and stridor.   Cardiovascular: Negative for chest pain, palpitations and leg swelling.  Gastrointestinal: Negative for abdominal distention, abdominal pain, blood in stool, constipation, diarrhea, nausea and vomiting.  Endocrine: Negative for cold intolerance and heat intolerance.  Genitourinary: Negative for difficulty urinating, dysuria and hematuria.  Musculoskeletal: Positive for arthralgias. Negative for back pain, gait problem, joint swelling, myalgias, neck pain and neck stiffness.  Skin: Negative for color change, pallor, rash and wound.  Allergic/Immunologic: Positive for environmental allergies. Negative for food allergies.  Neurological: Positive for headaches. Negative for dizziness, tremors,  seizures, syncope, facial asymmetry, speech difficulty, weakness, light-headedness and numbness.  Hematological: Negative for adenopathy. Does not bruise/bleed easily.  Psychiatric/Behavioral: Negative for agitation, behavioral problems, confusion and sleep disturbance.       Objective:   Physical Exam  Constitutional: She is oriented to person, place, and time. Vital signs are normal. She appears well-developed and well-nourished. She is active and cooperative.  Non-toxic appearance. She does not have a sickly appearance. She appears ill. No distress.  HENT:  Head: Normocephalic and atraumatic.  Right Ear: Hearing, external ear and ear canal normal. Tympanic membrane is erythematous and bulging. A middle ear effusion is present.  Left Ear: Hearing, external ear and ear canal normal. Tympanic membrane is erythematous and bulging. A middle ear effusion is present.  Nose: Mucosal edema and rhinorrhea present. No nose lacerations, sinus tenderness, nasal deformity, septal deviation or nasal septal hematoma. No epistaxis.  No foreign bodies. Right sinus exhibits no maxillary sinus tenderness and no frontal sinus tenderness. Left sinus exhibits no maxillary sinus tenderness and no frontal sinus tenderness.  Mouth/Throat: Uvula is midline and mucous membranes are normal. Mucous membranes are not pale, not dry and not cyanotic. She does not have dentures. No oral lesions. No trismus in the jaw. Normal dentition. No dental abscesses, uvula swelling, lacerations or dental caries. Posterior oropharyngeal edema and posterior oropharyngeal erythema present. No oropharyngeal exudate or tonsillar abscesses. No tonsillar exudate.  Cobblestoning posterior pharynx; bilateral TMs air fluid level slight opacity 6 oclock bulging erythema centrally 6 to 12 oclock; bilateral allergic shiners; nasal turbinates edema erythema clear discharge  Eyes: Pupils are equal, round, and reactive to light. Conjunctivae, EOM and lids  are normal. Right eye exhibits no chemosis, no discharge, no exudate and no hordeolum. No foreign body present in the right eye. Left eye exhibits no chemosis, no discharge, no  exudate and no hordeolum. No foreign body present in the left eye. Right conjunctiva is not injected. Right conjunctiva has no hemorrhage. Left conjunctiva is not injected. Left conjunctiva has no hemorrhage. No scleral icterus. Right eye exhibits normal extraocular motion and no nystagmus. Left eye exhibits normal extraocular motion and no nystagmus. Right pupil is round and reactive. Left pupil is round and reactive. Pupils are equal.  Neck: Trachea normal, normal range of motion and phonation normal. Neck supple. No tracheal tenderness, no spinous process tenderness and no muscular tenderness present. No neck rigidity. No tracheal deviation, no edema, no erythema and normal range of motion present. No thyroid mass and no thyromegaly present.  Cardiovascular: Normal rate, regular rhythm, S1 normal, S2 normal, normal heart sounds and intact distal pulses. PMI is not displaced. Exam reveals no gallop and no friction rub.  No murmur heard. Pulmonary/Chest: Effort normal and breath sounds normal. No accessory muscle usage or stridor. No respiratory distress. She has no decreased breath sounds. She has no wheezes. She has no rhonchi. She has no rales. She exhibits no tenderness.  No cough observed in exam room; spoke full sentences without difficulty  Abdominal: Soft. Normal appearance. She exhibits no distension, no fluid wave and no ascites. There is no rigidity and no guarding.  Musculoskeletal: Normal range of motion. She exhibits no edema, tenderness or deformity.       Right shoulder: Normal.       Left shoulder: Normal.       Right elbow: Normal.      Left elbow: Normal.       Right hip: Normal.       Left hip: Normal.       Right knee: Normal.       Left knee: Normal.       Cervical back: Normal.       Thoracic back:  Normal.       Lumbar back: Normal.       Right hand: Normal.       Left hand: Normal.  Lymphadenopathy:       Head (right side): No submental, no submandibular, no tonsillar, no preauricular, no posterior auricular and no occipital adenopathy present.       Head (left side): No submental, no submandibular, no tonsillar, no preauricular, no posterior auricular and no occipital adenopathy present.    She has no cervical adenopathy.       Right cervical: No superficial cervical, no deep cervical and no posterior cervical adenopathy present.      Left cervical: No superficial cervical, no deep cervical and no posterior cervical adenopathy present.  Neurological: She is alert and oriented to person, place, and time. She has normal strength. She is not disoriented. She displays no atrophy and no tremor. No cranial nerve deficit or sensory deficit. She exhibits normal muscle tone. She displays no seizure activity. Coordination and gait normal. GCS eye subscore is 4. GCS verbal subscore is 5. GCS motor subscore is 6.  Gait sure and steady in hallway; in/out of chair and on/off exam table without difficulty  Skin: Skin is warm, dry and intact. Capillary refill takes less than 2 seconds. No abrasion, no bruising, no burn, no ecchymosis, no laceration, no lesion, no petechiae and no rash noted. She is not diaphoretic. No cyanosis or erythema. No pallor. Nails show no clubbing.  Psychiatric: She has a normal mood and affect. Her speech is normal and behavior is normal. Judgment and thought content normal. She is  not actively hallucinating. Cognition and memory are normal. She is attentive.  Nursing note and vitals reviewed.         Assessment & Plan:  A-otitis media bilateral acute; seasonal allergic rhinitis  P-augmentin 875mg  po bID x 10 days #20 RF0 dispensed from [facility].  Tylenol 1000mg  po QID prn pain OTC.  Discussed not to take mobic/meloxicam/ibuprofen/naproxen/advil/aleve/ibuprofen all on same day  that they are in the NSAID family choose 1 and may alternate with tylenol.  Discussed mobic/meloxican is a once a day dosing.  Naproxen/Aleve is twice a day dosing and motrin/ibuprofen/advil is TID dosing.  Supportive treatment.   No evidence of invasive bacterial infection, non toxic and well hydrated.   I do not see where any further testing or imaging is necessary at this time.   I will suggest supportive care, rest, good hygiene and encourage the patient to take adequate fluids.  The patient is to return to clinic or EMERGENCY ROOM if symptoms worsen or change significantly e.g. ear pain, fever, purulent discharge from ears or bleeding.  Exitcare handout on otitis media given to patient.  Patient verbalized agreement and understanding of treatment plan.    Restart for 30 days Patient may use normal saline nasal spray 2 sprays each nostril q2h wa as needed 1 bottle given from clinic stock.  Refilled flonase 1 spray each nostril BID #1 RF6 electronic Rx to her pharmacy of choice.  Patient denied personal or family history of ENT cancer.  OTC antihistamine of choice claritin/zyrtec 10mg  po daily.  Avoid triggers if possible.  Shower prior to bedtime if exposed to triggers.  If allergic dust/dust mites recommend mattress/pillow covers/encasements; washing linens, vacuuming, sweeping, dusting weekly.  Call or return to clinic as needed if these symptoms worsen or fail to improve as anticipated.   Exitcare handout on allergic rhinitis and sinus rinse  Patient verbalized understanding of instructions, agreed with plan of care and had no further questions at this time.  P2:  Avoidance and hand washing.

## 2018-09-28 NOTE — Patient Instructions (Signed)
Sinus Rinse What is a sinus rinse? A sinus rinse is a simple home treatment that is used to rinse your sinuses with a sterile mixture of salt and water (saline solution). Sinuses are air-filled spaces in your skull behind the bones of your face and forehead that open into your nasal cavity. You will use the following:  Saline solution.  Neti pot or spray bottle. This releases the saline solution into your nose and through your sinuses. Neti pots and spray bottles can be purchased at [employer], a health food store, or online.  When would I do a sinus rinse? A sinus rinse can help to clear mucus, dirt, dust, or pollen from the nasal cavity. You may do a sinus rinse when you have a cold, a virus, nasal allergy symptoms, a sinus infection, or stuffiness in the nose or sinuses. If you are considering a sinus rinse:  Ask your child's health care provider before performing a sinus rinse on your child.  Do not do a sinus rinse if you have had ear or nasal surgery, ear infection, or blocked ears.  How do I do a sinus rinse?  Wash your hands.  Disinfect your device according to the directions provided and then dry it.  Use the solution that comes with your device or one that is sold separately in stores. Follow the mixing directions on the package.  Fill your device with the amount of saline solution as directed by the device instructions.  Stand over a sink and tilt your head sideways over the sink.  Place the spout of the device in your upper nostril (the one closer to the ceiling).  Gently pour or squeeze the saline solution into the nasal cavity. The liquid should drain to the lower nostril if you are not overly congested.  Gently blow your nose. Blowing too hard may cause ear pain.  Repeat in the other nostril.  Clean and rinse your device with clean water and then air-dry it. Are there risks of a sinus rinse? Sinus rinse is generally very safe and effective. However,  there are a few risks, which include:  A burning sensation in the sinuses. This may happen if you do not make the saline solution as directed. Make sure to follow all directions when making the saline solution.  Infection from contaminated water. This is rare, but possible.  Nasal irritation.  This information is not intended to replace advice given to you by your health care provider. Make sure you discuss any questions you have with your health care provider. Document Released: 07/05/2014 Document Revised: 11/04/2016 Document Reviewed: 04/25/2014 Elsevier Interactive Patient Education  2017 [employer] Allergic Rhinitis, Adult Allergic rhinitis is an allergic reaction that affects the mucous membrane inside the nose. It causes sneezing, a runny or stuffy nose, and the feeling of mucus going down the back of the throat (postnasal drip). Allergic rhinitis can be mild to severe. There are two types of allergic rhinitis:  Seasonal. This type is also called hay fever. It happens only during certain seasons.  Perennial. This type can happen at any time of the year.  What are the causes? This condition happens when the body's defense system (immune system) responds to certain harmless substances called allergens as though they were germs.  Seasonal allergic rhinitis is triggered by pollen, which can come from grasses, trees, and weeds. Perennial allergic rhinitis may be caused by:  House dust mites.  Pet dander.  Mold spores.  What are the  signs or symptoms? Symptoms of this condition include:  Sneezing.  Runny or stuffy nose (nasal congestion).  Postnasal drip.  Itchy nose.  Tearing of the eyes.  Trouble sleeping.  Daytime sleepiness.  How is this diagnosed? This condition may be diagnosed based on:  Your medical history.  A physical exam.  Tests to check for related conditions, such as: ? Asthma. ? Pink eye. ? Ear infection. ? Upper respiratory  infection.  Tests to find out which allergens trigger your symptoms. These may include skin or blood tests.  How is this treated? There is no cure for this condition, but treatment can help control symptoms. Treatment may include:  Taking medicines that block allergy symptoms, such as antihistamines. Medicine may be given as a shot, nasal spray, or pill.  Avoiding the allergen.  Desensitization. This treatment involves getting ongoing shots until your body becomes less sensitive to the allergen. This treatment may be done if other treatments do not help.  If taking medicine and avoiding the allergen does not work, new, stronger medicines may be prescribed.  Follow these instructions at home:  Find out what you are allergic to. Common allergens include smoke, dust, and pollen.  Avoid the things you are allergic to. These are some things you can do to help avoid allergens: ? Replace carpet with wood, tile, or vinyl flooring. Carpet can trap dander and dust. ? Do not smoke. Do not allow smoking in your home. ? Change your heating and air conditioning filter at least once a month. ? During allergy season:  Keep windows closed as much as possible.  Plan outdoor activities when pollen counts are lowest. This is usually during the evening hours.  When coming indoors, change clothing and shower before sitting on furniture or bedding.  Take over-the-counter and prescription medicines only as told by your health care provider.  Keep all follow-up visits as told by your health care provider. This is important. Contact a health care provider if:  You have a fever.  You develop a persistent cough.  You make whistling sounds when you breathe (you wheeze).  Your symptoms interfere with your normal daily activities. Get help right away if:  You have shortness of breath. Summary  This condition can be managed by taking medicines as directed and avoiding allergens.  Contact your  health care provider if you develop a persistent cough or fever.  During allergy season, keep windows closed as much as possible. This information is not intended to replace advice given to you by your health care provider. Make sure you discuss any questions you have with your health care provider. Document Released: 09/02/2001 Document Revised: 01/15/2017 Document Reviewed: 01/15/2017 Elsevier Interactive Patient Education  2018 [employer]. Otitis Media, Adult Otitis media occurs when there is inflammation and fluid in the middle ear. Your middle ear is a part of the ear that contains bones for hearing as well as air that helps send sounds to your brain. What are the causes? This condition is caused by a blockage in the eustachian tube. This tube drains fluid from the ear to the back of the nose (nasopharynx). A blockage in this tube can be caused by an object or by swelling (edema) in the tube. Problems that can cause a blockage include:  A cold or other upper respiratory infection.  Allergies.  An irritant, such as tobacco smoke.  Enlarged adenoids. The adenoids are areas of soft tissue located high in the back of the throat, behind the  nose and the roof of the mouth.  A mass in the nasopharynx.  Damage to the ear caused by pressure changes (barotrauma).  What are the signs or symptoms? Symptoms of this condition include:  Ear pain.  A fever.  Decreased hearing.  A headache.  Tiredness (lethargy).  Fluid leaking from the ear.  Ringing in the ear.  How is this diagnosed? This condition is diagnosed with a physical exam. During the exam your health care provider will use an instrument called an otoscope to look into your ear and check for redness, swelling, and fluid. He or she will also ask about your symptoms. Your health care provider may also order tests, such as:  A test to check the movement of the eardrum (pneumatic otoscopy). This test is done by squeezing a  small amount of air into the ear.  A test that changes air pressure in the middle ear to check how well the eardrum moves and whether the eustachian tube is working (tympanogram).  How is this treated? This condition usually goes away on its own within 3-5 days. But if the condition is caused by a bacteria infection and does not go away own its own, or keeps coming back, your health care provider may:  Prescribe antibiotic medicines to treat the infection.  Prescribe or recommend medicines to control pain.  Follow these instructions at home:  Take over-the-counter and prescription medicines only as told by your health care provider.  If you were prescribed an antibiotic medicine, take it as told by your health care provider. Do not stop taking the antibiotic even if you start to feel better.  Keep all follow-up visits as told by your health care provider. This is important. Contact a health care provider if:  You have bleeding from your nose.  There is a lump on your neck.  You are not getting better in 5 days.  You feel worse instead of better. Get help right away if:  You have severe pain that is not controlled with medicine.  You have swelling, redness, or pain around your ear.  You have stiffness in your neck.  A part of your face is paralyzed.  The bone behind your ear (mastoid) is tender when you touch it.  You develop a severe headache. Summary  Otitis media is redness, soreness, and swelling of the middle ear.  This condition usually goes away on its own within 3-5 days.  If the problem does not go away in 3-5 days, your health care provider may prescribe or recommend medicines to treat your symptoms.  If you were prescribed an antibiotic medicine, take it as told by your health care provider. This information is not intended to replace advice given to you by your health care provider. Make sure you discuss any questions you have with your health care  provider. Document Released: 09/12/2004 Document Revised: 11/28/2016 Document Reviewed: 11/28/2016 Elsevier Interactive Patient Education  [employer].

## 2018-11-25 ENCOUNTER — Encounter: Payer: Self-pay | Admitting: Registered Nurse

## 2018-11-25 ENCOUNTER — Ambulatory Visit: Payer: Self-pay | Admitting: Registered Nurse

## 2018-11-25 VITALS — BP 122/82 | HR 103 | Temp 99.5°F

## 2018-11-25 DIAGNOSIS — H66004 Acute suppurative otitis media without spontaneous rupture of ear drum, recurrent, right ear: Secondary | ICD-10-CM

## 2018-11-25 DIAGNOSIS — J0101 Acute recurrent maxillary sinusitis: Secondary | ICD-10-CM

## 2018-11-25 MED ORDER — LORATADINE 10 MG PO TABS: 10.0000 mg | ORAL_TABLET | Freq: Every day | ORAL | 11 refills | Status: DC

## 2018-11-25 MED ORDER — AMOXICILLIN-POT CLAVULANATE 875-125 MG PO TABS: 1.0000 | ORAL_TABLET | Freq: Two times a day (BID) | ORAL | 0 refills | Status: DC

## 2018-11-25 MED ORDER — MONTELUKAST SODIUM 10 MG PO TABS: 10.0000 mg | ORAL_TABLET | Freq: Every day | ORAL | 3 refills | Status: DC

## 2018-11-25 NOTE — Progress Notes (Signed)
Subjective:    Patient ID: Shari Shields, female    DOB: 12-25-1975, 42 y.o.   MRN: [id number]  42y/o African-American established female pt c/o L ear pain with eustachian tube discomfort intermittent over past 3 weeks. Some sore throat and upper dental pain as well and HA at temples, not specific frontal or maxillary sinus pressure. Has been taking OTC sinus and congestion med at home prn, none in last few days. Last AOM 09/28/18 augmentin and took claritin for 2 weeks.  + sick contacts at work viral URI     Review of Systems  Constitutional: Negative for activity change, appetite change, chills, diaphoresis, fatigue, fever and unexpected weight change.  HENT: Positive for congestion, ear pain, postnasal drip, sinus pressure and sore throat. Negative for dental problem, drooling, ear discharge, facial swelling, hearing loss, mouth sores, nosebleeds, rhinorrhea, sinus pain, sneezing, tinnitus, trouble swallowing and voice change.   Eyes: Negative for photophobia, pain, discharge, redness, itching and visual disturbance.  Respiratory: Negative for cough, choking, chest tightness, shortness of breath, wheezing and stridor.   Cardiovascular: Negative for chest pain, palpitations and leg swelling.  Gastrointestinal: Negative for abdominal distention, abdominal pain, blood in stool, constipation, diarrhea, nausea and vomiting.  Endocrine: Negative for cold intolerance and heat intolerance.  Genitourinary: Negative for difficulty urinating, dysuria and hematuria.  Musculoskeletal: Negative for arthralgias, back pain, gait problem, joint swelling, myalgias, neck pain and neck stiffness.  Skin: Negative for color change, pallor, rash and wound.  Allergic/Immunologic: Positive for environmental allergies. Negative for food allergies.  Neurological: Positive for headaches. Negative for dizziness, tremors, seizures, syncope, facial asymmetry, speech difficulty, weakness, light-headedness and numbness.   Hematological: Negative for adenopathy. Does not bruise/bleed easily.  Psychiatric/Behavioral: Negative for agitation, behavioral problems, confusion and sleep disturbance.       Objective:   Physical Exam  Constitutional: She is oriented to person, place, and time. Vital signs are normal. She appears well-developed and well-nourished. She is active and cooperative.  Non-toxic appearance. She does not have a sickly appearance. She appears ill. No distress.  HENT:  Head: Normocephalic and atraumatic.  Right Ear: Hearing, external ear and ear canal normal. Tympanic membrane is erythematous and bulging. A middle ear effusion is present.  Left Ear: Hearing, external ear and ear canal normal. A middle ear effusion is present.  Nose: Mucosal edema and rhinorrhea present. No nose lacerations, sinus tenderness, nasal deformity, septal deviation or nasal septal hematoma. No epistaxis.  No foreign bodies. Right sinus exhibits maxillary sinus tenderness. Right sinus exhibits no frontal sinus tenderness. Left sinus exhibits maxillary sinus tenderness. Left sinus exhibits no frontal sinus tenderness.  Mouth/Throat: Uvula is midline and mucous membranes are normal. Mucous membranes are not pale, not dry and not cyanotic. She does not have dentures. No oral lesions. No trismus in the jaw. Normal dentition. No dental abscesses, uvula swelling, lacerations or dental caries. Posterior oropharyngeal edema and posterior oropharyngeal erythema present. No oropharyngeal exudate or tonsillar abscesses.  Bilateral TMs with air fluid level and right TM erythema 50% 3-9 oclock and bulging; left TM abrasion noted; bilateral allergic shiners; nasal turbinates edema erythema clear discharge; cobblestoning posterior pharynx  Eyes: Pupils are equal, round, and reactive to light. Conjunctivae, EOM and lids are normal. Right eye exhibits no chemosis, no discharge, no exudate and no hordeolum. No foreign body present in the right  eye. Left eye exhibits no chemosis, no discharge, no exudate and no hordeolum. No foreign body present in the left eye. Right  conjunctiva is not injected. Right conjunctiva has no hemorrhage. Left conjunctiva is not injected. Left conjunctiva has no hemorrhage. No scleral icterus. Right eye exhibits normal extraocular motion and no nystagmus. Left eye exhibits normal extraocular motion and no nystagmus. Right pupil is round and reactive. Left pupil is round and reactive. Pupils are equal.  Neck: Trachea normal, normal range of motion and phonation normal. Neck supple. No tracheal tenderness, no spinous process tenderness and no muscular tenderness present. No neck rigidity. No tracheal deviation, no edema, no erythema and normal range of motion present. No thyroid mass and no thyromegaly present.  Cardiovascular: Normal rate, regular rhythm, S1 normal, S2 normal, normal heart sounds and intact distal pulses. PMI is not displaced. Exam reveals no gallop, no distant heart sounds and no friction rub.  No murmur heard. Pulmonary/Chest: Effort normal and breath sounds normal. No accessory muscle usage or stridor. No respiratory distress. She has no decreased breath sounds. She has no wheezes. She has no rhonchi. She has no rales. She exhibits no tenderness.  No cough observed in exam room; spoke full sentences without difficulty  Abdominal: Soft. Normal appearance. She exhibits no distension, no pulsatile liver, no fluid wave and no ascites. There is no rigidity and no guarding.  Musculoskeletal: Normal range of motion. She exhibits no edema, tenderness or deformity.       Right shoulder: Normal.       Left shoulder: Normal.       Right elbow: Normal.      Left elbow: Normal.       Right hip: Normal.       Left hip: Normal.       Right knee: Normal.       Left knee: Normal.       Cervical back: Normal.       Thoracic back: Normal.       Lumbar back: Normal.       Right hand: Normal.       Left hand:  Normal.  Lymphadenopathy:       Head (right side): No submental, no submandibular, no tonsillar, no preauricular, no posterior auricular and no occipital adenopathy present.       Head (left side): No submental, no submandibular, no tonsillar, no preauricular, no posterior auricular and no occipital adenopathy present.    She has no cervical adenopathy.       Right cervical: No superficial cervical, no deep cervical and no posterior cervical adenopathy present.      Left cervical: No superficial cervical, no deep cervical and no posterior cervical adenopathy present.  Neurological: She is alert and oriented to person, place, and time. She has normal strength. She is not disoriented. She displays no atrophy and no tremor. No cranial nerve deficit or sensory deficit. She exhibits normal muscle tone. She displays no seizure activity. Coordination and gait normal. GCS eye subscore is 4. GCS verbal subscore is 5. GCS motor subscore is 6.  On/off exam table and in/out of chair without difficulty; gait sure and steady in hallway  Skin: Skin is warm, dry and intact. Capillary refill takes less than 2 seconds. No abrasion, no bruising, no burn, no ecchymosis, no laceration, no lesion, no petechiae and no rash noted. She is not diaphoretic. No cyanosis or erythema. No pallor. Nails show no clubbing.  Psychiatric: She has a normal mood and affect. Her speech is normal and behavior is normal. Judgment and thought content normal. She is not actively hallucinating. Cognition and memory  are normal. She is attentive.  Nursing note and vitals reviewed.         Assessment & Plan:  A-right acute otitis media recurrent, acute recurrent maxillary sinusitis  P-.consistent use flonase 1 spray each nostril BID, saline 2 sprays each nostril q2h wa prn congestion.  Electronic Rx singulair 10mg  po QHS #30 RF3 to her pharmacy of choice and restart claritin 10mg  po daily OTC   Denied personal or family history of ENT  cancer.  Shower BID especially prior to bed. No evidence of systemic bacterial infection, non toxic and well hydrated.  I do not see where any further testing or imaging is necessary at this time.   I will suggest supportive care, rest, good hygiene and encourage the patient to take adequate fluids.  The patient is to return to clinic or EMERGENCY ROOM if symptoms worsen or change significantly.  Exitcare handout on allergic rhinitis, nonallergic rhinitis, sinusitis and sinus rinse.  Patient verbalized agreement and understanding of treatment plan and had no further questions at this time.   P2:  Hand washing and cover cough  Will take at least 30 days for fluid to drain from middle ears.  Supportive treatment. Augmentin 875mg  po BID x 10 days #20 RF0 dispensed from [facility] to patient  Tylenol 1000mg  po QID prn pain/fever or may use her naproxen at home.   No evidence of invasive bacterial infection, non toxic and well hydrated.  This is most likely self limiting viral infection.  I do not see where any further testing or imaging is necessary at this time.   I will suggest supportive care, rest, good hygiene and encourage the patient to take adequate fluids.  The patient is to return to clinic or EMERGENCY ROOM if symptoms worsen or change significantly e.g. ear pain, fever, purulent discharge from ears or bleeding.  Exitcare handout on otitis media   Patient verbalized agreement and understanding of treatment plan.    Patient may use normal saline nasal spray 2 sprays each nostril q2h wa as needed. flonase 1 spray each nostril BID #1 RF0.  Patient denied personal or family history of ENT cancer.  OTC antihistamine of choice claritin/zyrtec 10mg  po daily.  Avoid triggers if possible.  Shower prior to bedtime if exposed to triggers.  If allergic dust/dust mites recommend mattress/pillow covers/encasements; washing linens, vacuuming, sweeping, dusting weekly.  Call or return to clinic as needed if these  symptoms worsen or fail to improve as anticipated.   Exitcare handout on allergic rhinitis and sinus rinse given to patient.  Patient verbalized understanding of instructions, agreed with plan of care and had no further questions at this time.  P2:  Avoidance and hand washing.

## 2018-11-25 NOTE — Patient Instructions (Signed)
Allergic Rhinitis, Adult Allergic rhinitis is an allergic reaction that affects the mucous membrane inside the nose. It causes sneezing, a runny or stuffy nose, and the feeling of mucus going down the back of the throat (postnasal drip). Allergic rhinitis can be mild to severe. There are two types of allergic rhinitis:  Seasonal. This type is also called hay fever. It happens only during certain seasons.  Perennial. This type can happen at any time of the year.  What are the causes? This condition happens when the body's defense system (immune system) responds to certain harmless substances called allergens as though they were germs.  Seasonal allergic rhinitis is triggered by pollen, which can come from grasses, trees, and weeds. Perennial allergic rhinitis may be caused by:  House dust mites.  Pet dander.  Mold spores.  What are the signs or symptoms? Symptoms of this condition include:  Sneezing.  Runny or stuffy nose (nasal congestion).  Postnasal drip.  Itchy nose.  Tearing of the eyes.  Trouble sleeping.  Daytime sleepiness.  How is this diagnosed? This condition may be diagnosed based on:  Your medical history.  A physical exam.  Tests to check for related conditions, such as: ? Asthma. ? Pink eye. ? Ear infection. ? Upper respiratory infection.  Tests to find out which allergens trigger your symptoms. These may include skin or blood tests.  How is this treated? There is no cure for this condition, but treatment can help control symptoms. Treatment may include:  Taking medicines that block allergy symptoms, such as antihistamines. Medicine may be given as a shot, nasal spray, or pill.  Avoiding the allergen.  Desensitization. This treatment involves getting ongoing shots until your body becomes less sensitive to the allergen. This treatment may be done if other treatments do not help.  If taking medicine and avoiding the allergen does not work, new,  stronger medicines may be prescribed.  Follow these instructions at home:  Find out what you are allergic to. Common allergens include smoke, dust, and pollen.  Avoid the things you are allergic to. These are some things you can do to help avoid allergens: ? Replace carpet with wood, tile, or vinyl flooring. Carpet can trap dander and dust. ? Do not smoke. Do not allow smoking in your home. ? Change your heating and air conditioning filter at least once a month. ? During allergy season:  Keep windows closed as much as possible.  Plan outdoor activities when pollen counts are lowest. This is usually during the evening hours.  When coming indoors, change clothing and shower before sitting on furniture or bedding.  Take over-the-counter and prescription medicines only as told by your health care provider.  Keep all follow-up visits as told by your health care provider. This is important. Contact a health care provider if:  You have a fever.  You develop a persistent cough.  You make whistling sounds when you breathe (you wheeze).  Your symptoms interfere with your normal daily activities. Get help right away if:  You have shortness of breath. Summary  This condition can be managed by taking medicines as directed and avoiding allergens.  Contact your health care provider if you develop a persistent cough or fever.  During allergy season, keep windows closed as much as possible. This information is not intended to replace advice given to you by your health care provider. Make sure you discuss any questions you have with your health care provider. Document Released: 09/02/2001 Document Revised: 01/15/2017  Document Reviewed: 01/15/2017 Elsevier Interactive Patient Education  2018 [employer]. Nonallergic Rhinitis Nonallergic rhinitis is a condition that causes symptoms that affect the nose, such as a runny nose and a stuffed-up nose (nasal congestion) that can make it hard to  breathe through the nose. This condition is different from having an allergy (allergic rhinitis). Allergic rhinitis occurs when the body's defense system (immune system) reacts to a substance that you are allergic to (allergen), such as pollen, pet dander, mold, or dust. Nonallergic rhinitis has many similar symptoms, but it is not caused by allergens. Nonallergic rhinitis can be a short-term or long-term problem. What are the causes? This condition can be caused by many different things. Some common types of nonallergic rhinitis include: Infectious rhinitis  This is usually due to an infection in the upper respiratory tract. Vasomotor rhinitis  This is the most common type of long-term nonallergic rhinitis.  It is caused by too much blood flow through the nose, which makes the tissue inside of the nose swell.  Symptoms are often triggered by strong odors, cold air, stress, drinking alcohol, cigarette smoke, or changes in the weather. Occupational rhinitis  This type is caused by triggers in the workplace, such as chemicals, dusts, animal dander, or air pollution. Hormonal rhinitis  This type occurs in women as a result of an increase in the female hormone estrogen.  It may occur during pregnancy, puberty, and menstrual cycles.  Symptoms improve when estrogen levels drop. Drug-induced rhinitis Several drugs can cause nonallergic rhinitis, including:  Medicines that are used to treat high blood pressure, heart disease, and Parkinson disease.  Aspirin and NSAIDs.  Over-the-counter nasal decongestant sprays. These can cause a type of nonallergic rhinitis (rhinitis medicamentosa) when they are used for more than a few days.  Nonallergic rhinitis with eosinophilia syndrome (NARES)  This type is caused by having too much of a certain type of white blood cell (eosinophil). Nonallergic rhinitis can also be caused by a reaction to eating hot or spicy foods. This does not usually cause  long-term symptoms. In some cases, the cause of nonallergic rhinitis is not known. What increases the risk? You are more likely to develop this condition if:  You are 69-67 years of age.  You are a woman. Women are twice as likely to have this condition.  What are the signs or symptoms? Common symptoms of this condition include:  Nasal congestion.  Runny nose.  The feeling of mucus going down the back of the throat (postnasal drip).  Trouble sleeping at night and daytime sleepiness.  Less common symptoms include:  Sneezing.  Coughing.  Itchy nose.  Bloodshot eyes.  How is this diagnosed? This condition may be diagnosed based on:  Your symptoms and medical history.  A physical exam.  Allergy testing to rule out allergic rhinitis. You may have skin tests or blood tests.  In some cases, the health care provider may take a swab of nasal secretions to look for an increased number of eosinophils. This would be done to confirm a diagnosis of NARES. How is this treated? Treatment for this condition depends on the cause. No single treatment works for everyone. Work with your health care provider to find the best treatment for you. Treatment may include:  Avoiding the things that trigger your symptoms.  Using medicines to relieve congestion, such as: ? Steroid nasal spray. There are many types. You may need to try a few to find out which one works best. ? Decongestant  medicine. This may be an oral medicine or a nasal spray. These medicines are only used for a short time.  Using medicines to relieve a runny nose. These may include antihistamine medicines or anticholinergic nasal sprays.  Surgery to remove tissue from inside the nose may be needed in severe cases if the condition has not improved after 6-12 months of medical treatment. Follow these instructions at home:  Take or use over-the-counter and prescription medicines only as told by your health care provider. Do not  stop using your medicine even if you start to feel better.  Use salt-water (saline) rinses or other solutions (nasal washes or irrigations) to wash or rinse out the inside of your nose as told by your health care provider.  Do not take NSAIDs or medicines that contain aspirin if they make your symptoms worse.  Do not drink alcohol if it makes your symptoms worse.  Do not use any tobacco products, such as cigarettes, chewing tobacco, and e-cigarettes. If you need help quitting, ask your health care provider.  Avoid secondhand smoke.  Get some exercise every day. Exercise may help reduce symptoms of nonallergic rhinitis for some people. Ask your health care provider how much exercise and what types of exercise are safe for you.  Sleep with the head of your bed raised (elevated). This may reduce nighttime nasal congestion.  Keep all follow-up visits as told by your health care provider. This is important. Contact a health care provider if:  You have a fever.  Your symptoms are getting worse at home.  Your symptoms are not responding to medicine.  You develop new symptoms, especially a headache or nosebleed. This information is not intended to replace advice given to you by your health care provider. Make sure you discuss any questions you have with your health care provider. Document Released: 03/31/2016 Document Revised: 05/15/2016 Document Reviewed: 02/28/2016 Elsevier Interactive Patient Education  2018 [employer]. Sinusitis, Adult Sinusitis is soreness and inflammation of your sinuses. Sinuses are hollow spaces in the bones around your face. Your sinuses are located:  Around your eyes.  In the middle of your forehead.  Behind your nose.  In your cheekbones.  Your sinuses and nasal passages are lined with a stringy fluid (mucus). Mucus normally drains out of your sinuses. When your nasal tissues become inflamed or swollen, the mucus can become trapped or blocked so air  cannot flow through your sinuses. This allows bacteria, viruses, and funguses to grow, which leads to infection. Sinusitis can develop quickly and last for 7?10 days (acute) or for more than 12 weeks (chronic). Sinusitis often develops after a cold. What are the causes? This condition is caused by anything that creates swelling in the sinuses or stops mucus from draining, including:  Allergies.  Asthma.  Bacterial or viral infection.  Abnormally shaped bones between the nasal passages.  Nasal growths that contain mucus (nasal polyps).  Narrow sinus openings.  Pollutants, such as chemicals or irritants in the air.  A foreign object stuck in the nose.  A fungal infection. This is rare.  What increases the risk? The following factors may make you more likely to develop this condition:  Having allergies or asthma.  Having had a recent cold or respiratory tract infection.  Having structural deformities or blockages in your nose or sinuses.  Having a weak immune system.  Doing a lot of swimming or diving.  Overusing nasal sprays.  Smoking.  What are the signs or symptoms? The main  symptoms of this condition are pain and a feeling of pressure around the affected sinuses. Other symptoms include:  Upper toothache.  Earache.  Headache.  Bad breath.  Decreased sense of smell and taste.  A cough that may get worse at night.  Fatigue.  Fever.  Thick drainage from your nose. The drainage is often green and it may contain pus (purulent).  Stuffy nose or congestion.  Postnasal drip. This is when extra mucus collects in the throat or back of the nose.  Swelling and warmth over the affected sinuses.  Sore throat.  Sensitivity to light.  How is this diagnosed? This condition is diagnosed based on symptoms, a medical history, and a physical exam. To find out if your condition is acute or chronic, your health care provider may:  Look in your nose for signs of  nasal polyps.  Tap over the affected sinus to check for signs of infection.  View the inside of your sinuses using an imaging device that has a light attached (endoscope).  If your health care provider suspects that you have chronic sinusitis, you may also:  Be tested for allergies.  Have a sample of mucus taken from your nose (nasal culture) and checked for bacteria.  Have a mucus sample examined to see if your sinusitis is related to an allergy.  If your sinusitis does not respond to treatment and it lasts longer than 8 weeks, you may have an MRI or CT scan to check your sinuses. These scans also help to determine how severe your infection is. In rare cases, a bone biopsy may be done to rule out more serious types of fungal sinus disease. How is this treated? Treatment for sinusitis depends on the cause and whether your condition is chronic or acute. If a virus is causing your sinusitis, your symptoms will go away on their own within 10 days. You may be given medicines to relieve your symptoms, including:  Topical nasal decongestants. They shrink swollen nasal passages and let mucus drain from your sinuses.  Antihistamines. These drugs block inflammation that is triggered by allergies. This can help to ease swelling in your nose and sinuses.  Topical nasal corticosteroids. These are nasal sprays that ease inflammation and swelling in your nose and sinuses.  Nasal saline washes. These rinses can help to get rid of thick mucus in your nose.  If your condition is caused by bacteria, you will be given an antibiotic medicine. If your condition is caused by a fungus, you will be given an antifungal medicine. Surgery may be needed to correct underlying conditions, such as narrow nasal passages. Surgery may also be needed to remove polyps. Follow these instructions at home: Medicines  Take, use, or apply over-the-counter and prescription medicines only as told by your health care provider.  These may include nasal sprays.  If you were prescribed an antibiotic medicine, take it as told by your health care provider. Do not stop taking the antibiotic even if you start to feel better. Hydrate and Humidify  Drink enough water to keep your urine clear or pale yellow. Staying hydrated will help to thin your mucus.  Use a cool mist humidifier to keep the humidity level in your home above 50%.  Inhale steam for 10-15 minutes, 3-4 times a day or as told by your health care provider. You can do this in the bathroom while a hot shower is running.  Limit your exposure to cool or dry air. Rest  Rest as much  as possible.  Sleep with your head raised (elevated).  Make sure to get enough sleep each night. General instructions  Apply a warm, moist washcloth to your face 3-4 times a day or as told by your health care provider. This will help with discomfort.  Wash your hands often with soap and water to reduce your exposure to viruses and other germs. If soap and water are not available, use hand sanitizer.  Do not smoke. Avoid being around people who are smoking (secondhand smoke).  Keep all follow-up visits as told by your health care provider. This is important. Contact a health care provider if:  You have a fever.  Your symptoms get worse.  Your symptoms do not improve within 10 days. Get help right away if:  You have a severe headache.  You have persistent vomiting.  You have pain or swelling around your face or eyes.  You have vision problems.  You develop confusion.  Your neck is stiff.  You have trouble breathing. This information is not intended to replace advice given to you by your health care provider. Make sure you discuss any questions you have with your health care provider. Document Released: 12/08/2005 Document Revised: 08/03/2016 Document Reviewed: 10/03/2015 Elsevier Interactive Patient Education  2018 [employer] Sinus Rinse What is a sinus  rinse? A sinus rinse is a simple home treatment that is used to rinse your sinuses with a sterile mixture of salt and water (saline solution). Sinuses are air-filled spaces in your skull behind the bones of your face and forehead that open into your nasal cavity. You will use the following:  Saline solution.  Neti pot or spray bottle. This releases the saline solution into your nose and through your sinuses. Neti pots and spray bottles can be purchased at [employer], a health food store, or online.  When would I do a sinus rinse? A sinus rinse can help to clear mucus, dirt, dust, or pollen from the nasal cavity. You may do a sinus rinse when you have a cold, a virus, nasal allergy symptoms, a sinus infection, or stuffiness in the nose or sinuses. If you are considering a sinus rinse:  Ask your child's health care provider before performing a sinus rinse on your child.  Do not do a sinus rinse if you have had ear or nasal surgery, ear infection, or blocked ears.  How do I do a sinus rinse?  Wash your hands.  Disinfect your device according to the directions provided and then dry it.  Use the solution that comes with your device or one that is sold separately in stores. Follow the mixing directions on the package.  Fill your device with the amount of saline solution as directed by the device instructions.  Stand over a sink and tilt your head sideways over the sink.  Place the spout of the device in your upper nostril (the one closer to the ceiling).  Gently pour or squeeze the saline solution into the nasal cavity. The liquid should drain to the lower nostril if you are not overly congested.  Gently blow your nose. Blowing too hard may cause ear pain.  Repeat in the other nostril.  Clean and rinse your device with clean water and then air-dry it. Are there risks of a sinus rinse? Sinus rinse is generally very safe and effective. However, there are a few risks, which  include:  A burning sensation in the sinuses. This may happen if you do not make  the saline solution as directed. Make sure to follow all directions when making the saline solution.  Infection from contaminated water. This is rare, but possible.  Nasal irritation.  This information is not intended to replace advice given to you by your health care provider. Make sure you discuss any questions you have with your health care provider. Document Released: 07/05/2014 Document Revised: 11/04/2016 Document Reviewed: 04/25/2014 Elsevier Interactive Patient Education  2017 [employer] Otitis Media, Adult Otitis media occurs when there is inflammation and fluid in the middle ear. Your middle ear is a part of the ear that contains bones for hearing as well as air that helps send sounds to your brain. What are the causes? This condition is caused by a blockage in the eustachian tube. This tube drains fluid from the ear to the back of the nose (nasopharynx). A blockage in this tube can be caused by an object or by swelling (edema) in the tube. Problems that can cause a blockage include:  A cold or other upper respiratory infection.  Allergies.  An irritant, such as tobacco smoke.  Enlarged adenoids. The adenoids are areas of soft tissue located high in the back of the throat, behind the nose and the roof of the mouth.  A mass in the nasopharynx.  Damage to the ear caused by pressure changes (barotrauma).  What are the signs or symptoms? Symptoms of this condition include:  Ear pain.  A fever.  Decreased hearing.  A headache.  Tiredness (lethargy).  Fluid leaking from the ear.  Ringing in the ear.  How is this diagnosed? This condition is diagnosed with a physical exam. During the exam your health care provider will use an instrument called an otoscope to look into your ear and check for redness, swelling, and fluid. He or she will also ask about your symptoms. Your health care  provider may also order tests, such as:  A test to check the movement of the eardrum (pneumatic otoscopy). This test is done by squeezing a small amount of air into the ear.  A test that changes air pressure in the middle ear to check how well the eardrum moves and whether the eustachian tube is working (tympanogram).  How is this treated? This condition usually goes away on its own within 3-5 days. But if the condition is caused by a bacteria infection and does not go away own its own, or keeps coming back, your health care provider may:  Prescribe antibiotic medicines to treat the infection.  Prescribe or recommend medicines to control pain.  Follow these instructions at home:  Take over-the-counter and prescription medicines only as told by your health care provider.  If you were prescribed an antibiotic medicine, take it as told by your health care provider. Do not stop taking the antibiotic even if you start to feel better.  Keep all follow-up visits as told by your health care provider. This is important. Contact a health care provider if:  You have bleeding from your nose.  There is a lump on your neck.  You are not getting better in 5 days.  You feel worse instead of better. Get help right away if:  You have severe pain that is not controlled with medicine.  You have swelling, redness, or pain around your ear.  You have stiffness in your neck.  A part of your face is paralyzed.  The bone behind your ear (mastoid) is tender when you touch it.  You develop a severe  headache. Summary  Otitis media is redness, soreness, and swelling of the middle ear.  This condition usually goes away on its own within 3-5 days.  If the problem does not go away in 3-5 days, your health care provider may prescribe or recommend medicines to treat your symptoms.  If you were prescribed an antibiotic medicine, take it as told by your health care provider. This information is not  intended to replace advice given to you by your health care provider. Make sure you discuss any questions you have with your health care provider. Document Released: 09/12/2004 Document Revised: 11/28/2016 Document Reviewed: 11/28/2016 Elsevier Interactive Patient Education  [employer]2018 [employer]

## 2019-03-17 ENCOUNTER — Ambulatory Visit: Payer: Self-pay | Admitting: Registered Nurse

## 2019-03-17 ENCOUNTER — Other Ambulatory Visit: Payer: Self-pay

## 2019-03-17 ENCOUNTER — Encounter: Payer: Self-pay | Admitting: Registered Nurse

## 2019-03-17 VITALS — BP 138/92 | HR 78 | Temp 99.8°F

## 2019-03-17 DIAGNOSIS — J0101 Acute recurrent maxillary sinusitis: Secondary | ICD-10-CM

## 2019-03-17 DIAGNOSIS — H6693 Otitis media, unspecified, bilateral: Secondary | ICD-10-CM

## 2019-03-17 DIAGNOSIS — J301 Allergic rhinitis due to pollen: Secondary | ICD-10-CM

## 2019-03-17 DIAGNOSIS — M25561 Pain in right knee: Secondary | ICD-10-CM

## 2019-03-17 MED ORDER — PHENYLEPHRINE HCL 10 MG PO TABS: 10.0000 mg | ORAL_TABLET | Freq: Four times a day (QID) | ORAL | Status: AC | PRN

## 2019-03-17 MED ORDER — NAPROXEN 500 MG PO TABS: 500.0000 mg | ORAL_TABLET | Freq: Two times a day (BID) | ORAL | 0 refills | Status: DC

## 2019-03-17 MED ORDER — FLUTICASONE PROPIONATE 50 MCG/ACT NA SUSP: 1.0000 | Freq: Two times a day (BID) | NASAL | 6 refills | Status: DC

## 2019-03-17 MED ORDER — AMOXICILLIN-POT CLAVULANATE 875-125 MG PO TABS: 1.0000 | ORAL_TABLET | Freq: Two times a day (BID) | ORAL | 0 refills | Status: AC

## 2019-03-17 MED ORDER — SALINE SPRAY 0.65 % NA SOLN: 2.0000 | NASAL | 0 refills | Status: DC

## 2019-03-17 NOTE — Patient Instructions (Addendum)
Allergic Rhinitis, Adult Allergic rhinitis is an allergic reaction that affects the mucous membrane inside the nose. It causes sneezing, a runny or stuffy nose, and the feeling of mucus going down the back of the throat (postnasal drip). Allergic rhinitis can be mild to severe. There are two types of allergic rhinitis:  Seasonal. This type is also called hay fever. It happens only during certain seasons.  Perennial. This type can happen at any time of the year. What are the causes? This condition happens when the body's defense system (immune system) responds to certain harmless substances called allergens as though they were germs.  Seasonal allergic rhinitis is triggered by pollen, which can come from grasses, trees, and weeds. Perennial allergic rhinitis may be caused by:  House dust mites.  Pet dander.  Mold spores. What are the signs or symptoms? Symptoms of this condition include:  Sneezing.  Runny or stuffy nose (nasal congestion).  Postnasal drip.  Itchy nose.  Tearing of the eyes.  Trouble sleeping.  Daytime sleepiness. How is this diagnosed? This condition may be diagnosed based on:  Your medical history.  A physical exam.  Tests to check for related conditions, such as: ? Asthma. ? Pink eye. ? Ear infection. ? Upper respiratory infection.  Tests to find out which allergens trigger your symptoms. These may include skin or blood tests. How is this treated? There is no cure for this condition, but treatment can help control symptoms. Treatment may include:  Taking medicines that block allergy symptoms, such as antihistamines. Medicine may be given as a shot, nasal spray, or pill.  Avoiding the allergen.  Desensitization. This treatment involves getting ongoing shots until your body becomes less sensitive to the allergen. This treatment may be done if other treatments do not help.  If taking medicine and avoiding the allergen does not work, new, stronger  medicines may be prescribed. Follow these instructions at home:  Find out what you are allergic to. Common allergens include smoke, dust, and pollen.  Avoid the things you are allergic to. These are some things you can do to help avoid allergens: ? Replace carpet with wood, tile, or vinyl flooring. Carpet can trap dander and dust. ? Do not smoke. Do not allow smoking in your home. ? Change your heating and air conditioning filter at least once a month. ? During allergy season:  Keep windows closed as much as possible.  Plan outdoor activities when pollen counts are lowest. This is usually during the evening hours.  When coming indoors, change clothing and shower before sitting on furniture or bedding.  Take over-the-counter and prescription medicines only as told by your health care provider.  Keep all follow-up visits as told by your health care provider. This is important. Contact a health care provider if:  You have a fever.  You develop a persistent cough.  You make whistling sounds when you breathe (you wheeze).  Your symptoms interfere with your normal daily activities. Get help right away if:  You have shortness of breath. Summary  This condition can be managed by taking medicines as directed and avoiding allergens.  Contact your health care provider if you develop a persistent cough or fever.  During allergy season, keep windows closed as much as possible. This information is not intended to replace advice given to you by your health care provider. Make sure you discuss any questions you have with your health care provider. Document Released: 09/02/2001 Document Revised: 01/15/2017 Document Reviewed: 01/15/2017 Elsevier Interactive  Patient Education  2019 [employer]. Otitis Media, Adult  Otitis media occurs when there is inflammation and fluid in the middle ear. Your middle ear is a part of the ear that contains bones for hearing as well as air that helps send  sounds to your brain. What are the causes? This condition is caused by a blockage in the eustachian tube. This tube drains fluid from the ear to the back of the nose (nasopharynx). A blockage in this tube can be caused by an object or by swelling (edema) in the tube. Problems that can cause a blockage include:  A cold or other upper respiratory infection.  Allergies.  An irritant, such as tobacco smoke.  Enlarged adenoids. The adenoids are areas of soft tissue located high in the back of the throat, behind the nose and the roof of the mouth.  A mass in the nasopharynx.  Damage to the ear caused by pressure changes (barotrauma). What are the signs or symptoms? Symptoms of this condition include:  Ear pain.  A fever.  Decreased hearing.  A headache.  Tiredness (lethargy).  Fluid leaking from the ear.  Ringing in the ear. How is this diagnosed? This condition is diagnosed with a physical exam. During the exam your health care provider will use an instrument called an otoscope to look into your ear and check for redness, swelling, and fluid. He or she will also ask about your symptoms. Your health care provider may also order tests, such as:  A test to check the movement of the eardrum (pneumatic otoscopy). This test is done by squeezing a small amount of air into the ear.  A test that changes air pressure in the middle ear to check how well the eardrum moves and whether the eustachian tube is working (tympanogram). How is this treated? This condition usually goes away on its own within 3-5 days. But if the condition is caused by a bacteria infection and does not go away own its own, or keeps coming back, your health care provider may:  Prescribe antibiotic medicines to treat the infection.  Prescribe or recommend medicines to control pain. Follow these instructions at home:  Take over-the-counter and prescription medicines only as told by your health care provider.  If  you were prescribed an antibiotic medicine, take it as told by your health care provider. Do not stop taking the antibiotic even if you start to feel better.  Keep all follow-up visits as told by your health care provider. This is important. Contact a health care provider if:  You have bleeding from your nose.  There is a lump on your neck.  You are not getting better in 5 days.  You feel worse instead of better. Get help right away if:  You have severe pain that is not controlled with medicine.  You have swelling, redness, or pain around your ear.  You have stiffness in your neck.  A part of your face is paralyzed.  The bone behind your ear (mastoid) is tender when you touch it.  You develop a severe headache. Summary  Otitis media is redness, soreness, and swelling of the middle ear.  This condition usually goes away on its own within 3-5 days.  If the problem does not go away in 3-5 days, your health care provider may prescribe or recommend medicines to treat your symptoms.  If you were prescribed an antibiotic medicine, take it as told by your health care provider. This information is not intended  to replace advice given to you by your health care provider. Make sure you discuss any questions you have with your health care provider. Document Released: 09/12/2004 Document Revised: 11/28/2016 Document Reviewed: 11/28/2016 Elsevier Interactive Patient Education  2019 [employer]. How to Perform a Sinus Rinse A sinus rinse is a home treatment that is used to rinse your sinuses with a sterile mixture of salt and water (saline solution). Sinuses are air-filled spaces in your skull behind the bones of your face and forehead that open into your nasal cavity. A sinus rinse can help to clear mucus, dirt, dust, or pollen from your nasal cavity. You may do a sinus rinse when you have a cold, a virus, nasal allergy symptoms, a sinus infection, or stuffiness in your nose or  sinuses. Talk with your health care provider about whether a sinus rinse might help you. What are the risks? A sinus rinse is generally safe and effective. However, there are a few risks, which include:  A burning sensation in your sinuses. This may happen if you do not make the saline solution as directed. Be sure to follow all directions when making the saline solution.  Nasal irritation.  Infection from contaminated water. This is rare, but possible. Do not do a sinus rinse if you have had ear or nasal surgery, ear infection, or blocked ears. Supplies needed:  Saline solution or powder.  Distilled or sterile water may be needed to mix with saline powder. ? You may use boiled and cooled tap water. Boil tap water for 5 minutes; cool until it is lukewarm. Use within 24 hours. ? Do not use regular tap water to mix with the saline solution.  Neti pot or nasal rinse bottle. These supplies release the saline solution into your nose and through your sinuses. Neti pots and nasal rinse bottles can be purchased at [employer], a health food store, or online. How to perform a sinus rinse  1. Wash your hands with soap and water. 2. Wash your device according to the directions that came with the product and then dry it. 3. Use the solution that comes with your product or one that is sold separately in stores. Follow the mixing directions on the package if you need to mix with sterile or distilled water. 4. Fill the device with the amount of saline solution noted in the device instructions. 5. Stand over a sink and tilt your head sideways over the sink. 6. Place the spout of the device in your upper nostril (the one closer to the ceiling). 7. Gently pour or squeeze the saline solution into your nasal cavity. The liquid should drain out from the lower nostril if you are not too congested. 8. While rinsing, breathe through your open mouth. 9. Gently blow your nose to clear any mucus and rinse  solution. Blowing too hard may cause ear pain. 10. Repeat in your other nostril. 11. Clean and rinse your device with clean water and then air-dry it. Talk with your health care provider or pharmacist if you have questions about how to do a sinus rinse. Summary  A sinus rinse is a home treatment that is used to rinse your sinuses with a sterile mixture of salt and water (saline solution).  A sinus rinse is generally safe and effective. Follow all instructions carefully.  Before doing a sinus rinse, talk with your health care provider about whether it would be helpful for you. This information is not intended to replace advice given  to you by your health care provider. Make sure you discuss any questions you have with your health care provider. Document Released: 07/05/2014 Document Revised: 10/05/2017 Document Reviewed: 10/05/2017 Elsevier Interactive Patient Education  2019 [employer] Sinusitis, Adult Sinusitis is inflammation of your sinuses. Sinuses are hollow spaces in the bones around your face. Your sinuses are located:  Around your eyes.  In the middle of your forehead.  Behind your nose.  In your cheekbones. Mucus normally drains out of your sinuses. When your nasal tissues become inflamed or swollen, mucus can become trapped or blocked. This allows bacteria, viruses, and fungi to grow, which leads to infection. Most infections of the sinuses are caused by a virus. Sinusitis can develop quickly. It can last for up to 4 weeks (acute) or for more than 12 weeks (chronic). Sinusitis often develops after a cold. What are the causes? This condition is caused by anything that creates swelling in the sinuses or stops mucus from draining. This includes:  Allergies.  Asthma.  Infection from bacteria or viruses.  Deformities or blockages in your nose or sinuses.  Abnormal growths in the nose (nasal polyps).  Pollutants, such as chemicals or irritants in the air.  Infection  from fungi (rare). What increases the risk? You are more likely to develop this condition if you:  Have a weak body defense system (immune system).  Do a lot of swimming or diving.  Overuse nasal sprays.  Smoke. What are the signs or symptoms? The main symptoms of this condition are pain and a feeling of pressure around the affected sinuses. Other symptoms include:  Stuffy nose or congestion.  Thick drainage from your nose.  Swelling and warmth over the affected sinuses.  Headache.  Upper toothache.  A cough that may get worse at night.  Extra mucus that collects in the throat or the back of the nose (postnasal drip).  Decreased sense of smell and taste.  Fatigue.  A fever.  Sore throat.  Bad breath. How is this diagnosed? This condition is diagnosed based on:  Your symptoms.  Your medical history.  A physical exam.  Tests to find out if your condition is acute or chronic. This may include: ? Checking your nose for nasal polyps. ? Viewing your sinuses using a device that has a light (endoscope). ? Testing for allergies or bacteria. ? Imaging tests, such as an MRI or CT scan. In rare cases, a bone biopsy may be done to rule out more serious types of fungal sinus disease. How is this treated? Treatment for sinusitis depends on the cause and whether your condition is chronic or acute.  If caused by a virus, your symptoms should go away on their own within 10 days. You may be given medicines to relieve symptoms. They include: ? Medicines that shrink swollen nasal passages (topical intranasal decongestants). ? Medicines that treat allergies (antihistamines). ? A spray that eases inflammation of the nostrils (topical intranasal corticosteroids). ? Rinses that help get rid of thick mucus in your nose (nasal saline washes).  If caused by bacteria, your health care provider may recommend waiting to see if your symptoms improve. Most bacterial infections will get  better without antibiotic medicine. You may be given antibiotics if you have: ? A severe infection. ? A weak immune system.  If caused by narrow nasal passages or nasal polyps, you may need to have surgery. Follow these instructions at home: Medicines  Take, use, or apply over-the-counter and prescription medicines only as  told by your health care provider. These may include nasal sprays.  If you were prescribed an antibiotic medicine, take it as told by your health care provider. Do not stop taking the antibiotic even if you start to feel better. Hydrate and humidify   Drink enough fluid to keep your urine pale yellow. Staying hydrated will help to thin your mucus.  Use a cool mist humidifier to keep the humidity level in your home above 50%.  Inhale steam for 10-15 minutes, 3-4 times a day, or as told by your health care provider. You can do this in the bathroom while a hot shower is running.  Limit your exposure to cool or dry air. Rest  Rest as much as possible.  Sleep with your head raised (elevated).  Make sure you get enough sleep each night. General instructions   Apply a warm, moist washcloth to your face 3-4 times a day or as told by your health care provider. This will help with discomfort.  Wash your hands often with soap and water to reduce your exposure to germs. If soap and water are not available, use hand sanitizer.  Do not smoke. Avoid being around people who are smoking (secondhand smoke).  Keep all follow-up visits as told by your health care provider. This is important. Contact a health care provider if:  You have a fever.  Your symptoms get worse.  Your symptoms do not improve within 10 days. Get help right away if:  You have a severe headache.  You have persistent vomiting.  You have severe pain or swelling around your face or eyes.  You have vision problems.  You develop confusion.  Your neck is stiff.  You have trouble  breathing. Summary  Sinusitis is soreness and inflammation of your sinuses. Sinuses are hollow spaces in the bones around your face.  This condition is caused by nasal tissues that become inflamed or swollen. The swelling traps or blocks the flow of mucus. This allows bacteria, viruses, and fungi to grow, which leads to infection.  If you were prescribed an antibiotic medicine, take it as told by your health care provider. Do not stop taking the antibiotic even if you start to feel better.  Keep all follow-up visits as told by your health care provider. This is important. This information is not intended to replace advice given to you by your health care provider. Make sure you discuss any questions you have with your health care provider. Document Released: 12/08/2005 Document Revised: 05/10/2018 Document Reviewed: 05/10/2018 Elsevier Interactive Patient Education  2019 [employer]  Preventing Health Risks of Being Overweight Maintaining a healthy body weight is an important part of your overall health. Your healthy body weight depends on your age, gender, and height. Being overweight puts you at risk for many health problems, including:  Heart disease.  Diabetes.  Problems sleeping.  Joint problems. You can make changes to your diet and lifestyle to prevent these risks. Consider working with a health care provider or a dietitian to make these changes. What nutrition changes can be made?   Eat only as much as your body needs. In most cases, this is about 2,000 calories a day, but the amount varies depending on your height, gender, and activity level. Ask your health care provider how many calories you should have each day. Eating more than your body needs on a regular basis can cause you to become overweight or obese.  Eat slowly, and stop eating when you feel  full.  Choose healthy foods, including: ? Fruits and vegetables. ? Lean meats. ? Low-fat dairy products. ? High-fiber  foods, such as whole grains and beans. ? Healthy snacks like vegetable sticks, a piece of fruit, or a small amount of yogurt or cheese.  Avoid foods and drinks that are high in sugar, salt (sodium), saturated fat, or trans fat. This includes: ? Many desserts such as candy, cookies, and ice cream. ? Soda. ? Fried foods. ? Processed meats such as hot dogs or lunch meats. ? Prepackaged snack foods. What lifestyle changes can be made?   Exercise for at least 150 minutes a week to prevent weight gain, or as often as recommended by your health care provider. Do moderate-intensity exercise, such as brisk walking. ? Spread it out by exercising for 30 minutes 5 days a week, or in short 10-minute bursts several times a day.  Find other ways to stay active and burn calories, such as yard work or a hobby that involves physical activity.  Get at least 8 hours of sleep each night. When you are well-rested, you are more likely to be active and make healthy choices during the day. To sleep better: ? Try to go to bed and wake up at about the same time every day. ? Keep your bedroom dark, quiet, and cool. ? Make sure that your bed is comfortable. ? Avoid stimulating activities, such as watching television or exercising, for at least one hour before bedtime. Why are these changes important? Eating healthy and being active helps you lose weight and prevent health problems caused by being overweight. Making these changes can also help you manage stress, feel better mentally, and connect with friends and family. What can happen if changes are not made? Being overweight can affect you for your entire life. You may develop joint or bone problems that make it painful or difficult for you to play sports or do activities you enjoy. Being overweight puts stress on your heart and lungs and can lead to medical problems like diabetes, heart disease, and sleeping problems. Where to find support You can get support for  preventing health risks of being overweight from:  Your health care provider or a dietitian. They can provide guidance about healthy eating and healthy lifestyle choices.  Weight loss support groups, online or in-person. Where to find more information  MyPlate: https://ball-collins.biz/ ? This an online tool that provides personalized recommendations about foods to eat each day.  The Centers for Disease Control and Prevention: AffordableScrapbook.gl ? This resource gives tips for managing weight and having an active lifestyle. Summary  To prevent unhealthy weight gain, it is important to maintain a healthy diet high in vegetables and whole grains, exercise regularly, and get at least 8 hours of sleep each night.  Making these changes helps prevent many long-term (chronic) health conditions that can shorten your life, such as diabetes, heart disease, and stroke. This information is not intended to replace advice given to you by your health care provider. Make sure you discuss any questions you have with your health care provider. Document Released: 11/04/2017 Document Revised: 11/04/2017 Document Reviewed: 11/04/2017 Elsevier Interactive Patient Education  2019 [employer]  Acute Knee Pain, Adult Acute knee pain is sudden and may be caused by damage, swelling, or irritation of the muscles and tissues that support your knee. The injury may result from:  A fall.  An injury to your knee from twisting motions.  A hit to the knee.  Infection. Acute  knee pain may go away on its own with time and rest. If it does not, your health care provider may order tests to find the cause of the pain. These may include:  Imaging tests, such as an X-ray, MRI, or ultrasound.  Joint aspiration. In this test, fluid is removed from the knee.  Arthroscopy. In this test, a lighted tube is inserted into the knee and an image is projected onto a TV screen.  Biopsy. In this test, a sample of tissue is  removed from the body and studied under a microscope. Follow these instructions at home: Pay attention to any changes in your symptoms. Take these actions to relieve your pain. If you have a knee sleeve or brace:   Wear the sleeve or brace as told by your health care provider. Remove it only as told by your health care provider.  Loosen the sleeve or brace if your toes tingle, become numb, or turn cold and blue.  Keep the sleeve or brace clean.  If the sleeve or brace is not waterproof: ? Do not let it get wet. ? Cover it with a watertight covering when you take a bath or shower. Activity  Rest your knee.  Do not do things that cause pain or make pain worse.  Avoid high-impact activities or exercises, such as running, jumping rope, or doing jumping jacks.  Work with a physical therapist to make a safe exercise program, as recommended by your health care provider. Do exercises as told by your physical therapist. Managing pain, stiffness, and swelling   If directed, put ice on the knee: ? Put ice in a plastic bag. ? Place a towel between your skin and the bag. ? Leave the ice on for 20 minutes, 2-3 times a day.  If directed, use an elastic bandage to put pressure (compression) on your injured knee. This may control swelling, give support, and help with discomfort. General instructions  Take over-the-counter and prescription medicines only as told by your health care provider.  Raise (elevate) your knee above the level of your heart when you are sitting or lying down.  Sleep with a pillow under your knee.  Do not use any products that contain nicotine or tobacco, such as cigarettes, e-cigarettes, and chewing tobacco. These can delay healing. If you need help quitting, ask your health care provider.  If you are overweight, work with your health care provider and a dietitian to set a weight-loss goal that is healthy and reasonable for you. Extra weight can put pressure on your  knee.  Keep all follow-up visits as told by your health care provider. This is important. Contact a health care provider if:  Your knee pain continues, changes, or gets worse.  You have a fever along with knee pain.  Your knee feels warm to the touch.  Your knee buckles or locks up. Get help right away if:  Your knee swells, and the swelling becomes worse.  You cannot move your knee.  You have severe pain in your knee. Summary  Acute knee pain can be caused by a fall, an injury, an infection, or damage, swelling, or irritation of the tissues that support your knee.  Your health care provider may perform tests to find out the cause of the pain.  Pay attention to any changes in your symptoms. Relieve your pain with rest, medicines, light activity, and use of ice.  Get help if your pain continues or becomes worse, your knee swells, or you  cannot move your knee. This information is not intended to replace advice given to you by your health care provider. Make sure you discuss any questions you have with your health care provider. Document Released: 10/05/2007 Document Revised: 05/20/2018 Document Reviewed: 05/20/2018 [employer] Interactive Patient Education  2019 [employer]

## 2019-03-17 NOTE — Progress Notes (Signed)
Subjective:    Patient ID: Shari Shields, female    DOB: 09-03-1976, 43 y.o.   MRN: [id number]  42y/o African American female established pt c/o nasal congestion, otalgia, frontal sinus pressure. Denies cough, fever, chest congestion, rhinorrhea, loss of taste/smell, eye discharge. + sick contacts at work in the past month viral URI and she has noted seasonal allergy flare  Last antibiotics Dec 2019 augmentin worked well denied side effects. Requesting Naproxen refill for chronic knee pain working well for her denied GI upset.     Review of Systems  Constitutional: Negative for activity change, appetite change, chills, diaphoresis, fatigue, fever and unexpected weight change.  HENT: Positive for congestion, ear pain, postnasal drip, sinus pressure and sinus pain. Negative for dental problem, drooling, ear discharge, facial swelling, hearing loss, mouth sores, nosebleeds, rhinorrhea, sneezing, sore throat, tinnitus, trouble swallowing and voice change.   Eyes: Negative for photophobia, pain, discharge, redness, itching and visual disturbance.  Respiratory: Negative for cough, choking, chest tightness, shortness of breath, wheezing and stridor.   Cardiovascular: Negative for chest pain, palpitations and leg swelling.  Gastrointestinal: Negative for abdominal distention, abdominal pain, blood in stool, diarrhea, nausea and vomiting.  Endocrine: Negative for cold intolerance and heat intolerance.  Genitourinary: Negative for difficulty urinating, dysuria and hematuria.  Musculoskeletal: Positive for arthralgias and myalgias. Negative for back pain, gait problem, joint swelling, neck pain and neck stiffness.  Skin: Negative for color change, pallor, rash and wound.  Allergic/Immunologic: Positive for environmental allergies. Negative for food allergies.  Neurological: Positive for headaches. Negative for dizziness, tremors, seizures, syncope, facial asymmetry, speech difficulty, weakness,  light-headedness and numbness.  Hematological: Negative for adenopathy. Does not bruise/bleed easily.  Psychiatric/Behavioral: Negative for agitation, behavioral problems, confusion and sleep disturbance.       Objective:   Physical Exam Vitals signs and nursing note reviewed.  Constitutional:      General: She is not in acute distress.    Appearance: She is well-developed and well-groomed. She is obese. She is ill-appearing. She is not toxic-appearing or diaphoretic.  HENT:     Head: Normocephalic and atraumatic.     Jaw: There is normal jaw occlusion. No trismus.     Salivary Glands: Right salivary gland is not diffusely enlarged or tender. Left salivary gland is not diffusely enlarged or tender.     Comments: Bilateral TMs bulging erythema air fluid level clear; cobblestoning posterior pharynx; maxillary greater than frontal sinuses TTP bilaterally; bilateral allergic shiners; clear discharge bilateral nasal turbinates with edema erythema    Right Ear: Hearing, ear canal and external ear normal. A middle ear effusion is present. There is no impacted cerumen. Tympanic membrane is erythematous and bulging.     Left Ear: Hearing, ear canal and external ear normal. A middle ear effusion is present. There is no impacted cerumen. Tympanic membrane is erythematous and bulging.     Nose: Mucosal edema, congestion and rhinorrhea present. No nasal deformity, septal deviation or laceration.     Right Turbinates: Enlarged and swollen.     Left Turbinates: Enlarged and swollen.     Right Sinus: Maxillary sinus tenderness and frontal sinus tenderness present.     Left Sinus: Maxillary sinus tenderness and frontal sinus tenderness present.     Mouth/Throat:     Lips: Pink. No lesions.     Mouth: Mucous membranes are moist. Mucous membranes are not pale, not dry and not cyanotic. No lacerations, oral lesions or angioedema.     Dentition:  Normal dentition. Does not have dentures. No dental caries or  dental abscesses.     Pharynx: Uvula midline. Pharyngeal swelling and posterior oropharyngeal erythema present. No oropharyngeal exudate or uvula swelling.     Tonsils: No tonsillar exudate or tonsillar abscesses. 0 on the right. 0 on the left.  Eyes:     General: Lids are normal. Allergic shiner present. No visual field deficit or scleral icterus.       Right eye: No foreign body, discharge or hordeolum.        Left eye: No foreign body, discharge or hordeolum.     Extraocular Movements: Extraocular movements intact.     Right eye: Normal extraocular motion and no nystagmus.     Left eye: Normal extraocular motion and no nystagmus.     Conjunctiva/sclera: Conjunctivae normal.     Right eye: Right conjunctiva is not injected. No chemosis, exudate or hemorrhage.    Left eye: Left conjunctiva is not injected. No chemosis, exudate or hemorrhage.    Pupils: Pupils are equal, round, and reactive to light. Pupils are equal.     Right eye: Pupil is round and reactive.     Left eye: Pupil is round and reactive.  Neck:     Musculoskeletal: Normal range of motion and neck supple. Normal range of motion. No edema, erythema, neck rigidity, spinous process tenderness or muscular tenderness.     Thyroid: No thyroid mass or thyromegaly.     Trachea: Trachea and phonation normal. No tracheal tenderness or tracheal deviation.  Cardiovascular:     Rate and Rhythm: Normal rate and regular rhythm.     Chest Wall: PMI is not displaced.     Pulses: Normal pulses.     Heart sounds: Normal heart sounds, S1 normal and S2 normal. No murmur. No friction rub. No gallop.   Pulmonary:     Effort: Pulmonary effort is normal. No accessory muscle usage or respiratory distress.     Breath sounds: Normal breath sounds and air entry. No stridor, decreased air movement or transmitted upper airway sounds. No decreased breath sounds, wheezing, rhonchi or rales.     Comments: No cough observed in exam room; spoke full sentences  without difficulty Chest:     Chest wall: No tenderness.  Abdominal:     General: There is no distension.     Palpations: Abdomen is soft.  Musculoskeletal: Normal range of motion.        General: No swelling, tenderness or deformity.     Right shoulder: Normal.     Left shoulder: Normal.     Right elbow: Normal.    Left elbow: Normal.     Right hip: Normal.     Left hip: Normal.     Right knee: She exhibits normal range of motion, no swelling, no effusion, no deformity, no laceration, no erythema and normal alignment.     Left knee: Normal.     Cervical back: Normal.     Thoracic back: Normal.     Lumbar back: Normal.     Right hand: Normal.     Left hand: Normal.     Right lower leg: No edema.     Left lower leg: No edema.       Legs:  Lymphadenopathy:     Head:     Right side of head: No submental, submandibular, tonsillar, preauricular, posterior auricular or occipital adenopathy.     Left side of head: No submental, submandibular, tonsillar, preauricular, posterior  auricular or occipital adenopathy.     Cervical: No cervical adenopathy.     Right cervical: No superficial, deep or posterior cervical adenopathy.    Left cervical: No superficial, deep or posterior cervical adenopathy.  Skin:    General: Skin is warm and dry.     Capillary Refill: Capillary refill takes less than 2 seconds.     Coloration: Skin is not ashen, cyanotic, jaundiced, mottled, pale or sallow.     Findings: No abrasion, bruising, burn, ecchymosis, erythema, laceration, lesion, petechiae or rash.     Nails: There is no clubbing.   Neurological:     General: No focal deficit present.     Mental Status: She is alert and oriented to person, place, and time. Mental status is at baseline. She is not disoriented.     GCS: GCS eye subscore is 4. GCS verbal subscore is 5. GCS motor subscore is 6.     Cranial Nerves: Cranial nerves are intact. No cranial nerve deficit, dysarthria or facial asymmetry.      Sensory: Sensation is intact. No sensory deficit.     Motor: Motor function is intact. No weakness, tremor, atrophy, abnormal muscle tone or seizure activity.     Coordination: Coordination is intact. Coordination normal.     Gait: Gait is intact. Gait normal.     Comments: Gait sure and steady in hallway; on/off exam table and in/out of chair without difficulty; strength 5/5 upper and lower extremities equal bilateral  Psychiatric:        Attention and Perception: Attention and perception normal.        Mood and Affect: Mood and affect normal.        Speech: Speech normal.        Behavior: Behavior normal. Behavior is cooperative.        Thought Content: Thought content normal.        Cognition and Memory: Cognition and memory normal.        Judgment: Judgment normal.           Assessment & Plan:  A-bilateral otitis media acute, seasonal allergic rhinitis, right knee pain, acute recurrent maxillary sinusitis  P-Refilled flonase 1 spray each nostril BID #1 16gm RF6, saline 2 sprays each nostril q2h wa prn congestion. start augmentin 875mg  po BID x 10 days #20 RF0 dispensed from [facility] to patient  Denied personal or family history of ENT cancer.  Shower BID especially prior to bed. No evidence of systemic bacterial infection, non toxic and well hydrated.  I do not see where any further testing or imaging is necessary at this time.   I will suggest supportive care, rest, good hygiene and encourage the patient to take adequate fluids.  The patient is to return to clinic or EMERGENCY ROOM if symptoms worsen or change significantly.  Exitcare handout on sinusitis and sinus rinse.  Patient verbalized agreement and understanding of treatment plan and had no further questions at this time.   P2:  Hand washing and cover cough  Supportive treatment. Augmentin 875mg  po BID x 10 days #20 RF0 dispensed from [facility] to patient  Tylenol 1000mg  po QID prn pain/fever. May alternate with naproxen 500mg  po BID  #180 RF0 dispensed from [facility] to patient.   No evidence of invasive bacterial infection, non toxic and well hydrated.  This is most likely self limiting viral infection.  I do not see where any further testing or imaging is necessary at this time.   I will  suggest supportive care, rest, good hygiene and encourage the patient to take adequate fluids.  The patient is to return to clinic or EMERGENCY ROOM if symptoms worsen or change significantly e.g. ear pain, fever, purulent discharge from ears or bleeding.  Exitcare handout on otitis media acute  Patient verbalized agreement and understanding of treatment plan.    Patient may use normal saline nasal spray 2 sprays each nostril q2h wa as needed given 1 bottle from clinic stock. flonase 1 spray each nostril BID #1 RF6 electronic Rx to her pharmacy of choice.  Patient denied personal or family history of ENT cancer.  OTC antihistamine of choice claritin/zyrtec  po daily.  Phenylephrine 5-10mg  po q6h prn rhinitis given 4 UD from clinic stock.  Avoid triggers if possible.  Shower prior to bedtime if exposed to triggers.  If allergic dust/dust mites recommend mattress/pillow covers/encasements; washing linens, vacuuming, sweeping, dusting weekly.  Call or return to clinic as needed if these symptoms worsen or fail to improve as anticipated.   Exitcare handout on allergic rhinitis and sinus rinse given to patient.  Patient verbalized understanding of instructions, agreed with plan of care and had no further questions at this time.   Refilled naproxen  po BID #180 RF0 take with food electronic Rx to her pharmacy of choice.  Avoid high impact activities.  I recommend weight loss. Patient was instructed to rest, ice and elevate leg. Medications as directed. Patient may take NSAIDS as needed. Call or return to clinic as needed if these symptoms worsen or fail to improve as anticipated. Exitcare handout knee pain and preventing health risks related to weight  gain; consider knee sleeve support/ace bandage and recommend straight leg raises and heel slides daily set of 10 BID bilaterally.   Patient verbalized agreement and understanding of treatment plan and had no further questions at this time.  P2: ROM exercises, Stretching, and Hand out given  P2:  Avoidance and hand washing.

## 2019-05-17 ENCOUNTER — Ambulatory Visit: Payer: Self-pay | Admitting: Registered Nurse

## 2019-05-17 ENCOUNTER — Other Ambulatory Visit: Payer: Self-pay

## 2019-05-17 ENCOUNTER — Encounter: Payer: Self-pay | Admitting: Registered Nurse

## 2019-05-17 VITALS — BP 107/80 | HR 79

## 2019-05-17 DIAGNOSIS — S29011A Strain of muscle and tendon of front wall of thorax, initial encounter: Secondary | ICD-10-CM

## 2019-05-17 DIAGNOSIS — M62838 Other muscle spasm: Secondary | ICD-10-CM

## 2019-05-17 MED ORDER — ACETAMINOPHEN 500 MG PO TABS: 1000.0000 mg | ORAL_TABLET | Freq: Four times a day (QID) | ORAL | 0 refills | Status: AC | PRN

## 2019-05-17 MED ORDER — MENTHOL (TOPICAL ANALGESIC) 4 % EX GEL: 1.0000 "application " | Freq: Four times a day (QID) | CUTANEOUS | 0 refills | Status: AC | PRN

## 2019-05-17 MED ORDER — CYCLOBENZAPRINE HCL 10 MG PO TABS: 5.0000 mg | ORAL_TABLET | Freq: Three times a day (TID) | ORAL | 0 refills | Status: AC | PRN

## 2019-05-17 NOTE — Patient Instructions (Addendum)
Cervical Strain and Sprain Rehab Ask your health care provider which exercises are safe for you. Do exercises exactly as told by your health care provider and adjust them as directed. It is normal to feel mild stretching, pulling, tightness, or discomfort as you do these exercises, but you should stop right away if you feel sudden pain or your pain gets worse.Do not begin these exercises until told by your health care provider. Stretching and range of motion exercises These exercises warm up your muscles and joints and improve the movement and flexibility of your neck. These exercises also help to relieve pain, numbness, and tingling. Exercise A: Cervical side bend  1. Using good posture, sit on a stable chair or stand up. 2. Without moving your shoulders, slowly tilt your left / right ear to your shoulder until you feel a stretch in your neck muscles. You should be looking straight ahead. 3. Hold for _____15_____ seconds. 4. Repeat with the other side of your neck. Repeat ______3____ times. Complete this exercise _____2_____ times a day. Exercise B: Cervical rotation  1. Using good posture, sit on a stable chair or stand up. 2. Slowly turn your head to the side as if you are looking over your left / right shoulder. ? Keep your eyes level with the ground. ? Stop when you feel a stretch along the side and the back of your neck. 3. Hold for _____15_____ seconds. 4. Repeat this by turning to your other side. Repeat _____3_____ times. Complete this exercise _______2___ times a day. Exercise C: Thoracic extension and pectoral stretch 1. Roll a towel or a small blanket so it is about 4 inches (10 cm) in diameter. 2. Lie down on your back on a firm surface. 3. Put the towel lengthwise, under your spine in the middle of your back. It should not be not under your shoulder blades. The towel should line up with your spine from your middle back to your lower back. 4. Put your hands behind your head and  let your elbows fall out to your sides. 5. Hold for ____15______ seconds. Repeat ___3_______ times. Complete this exercise __2________ times a day. Strengthening exercises These exercises build strength and endurance in your neck. Endurance is the ability to use your muscles for a long time, even after your muscles get tired. Exercise D: Upper cervical flexion, isometric 1. Lie on your back with a thin pillow behind your head and a small rolled-up towel under your neck. 2. Gently tuck your chin toward your chest and nod your head down to look toward your feet. Do not lift your head off the pillow. 3. Hold for ______15____ seconds. 4. Release the tension slowly. Relax your neck muscles completely before you repeat this exercise. Repeat ______3____ times. Complete this exercise ____2______ times a day. Exercise E: Cervical extension, isometric  1. Stand about 6 inches (15 cm) away from a wall, with your back facing the wall. 2. Place a soft object, about 6-8 inches (15-20 cm) in diameter, between the back of your head and the wall. A soft object could be a small pillow, a ball, or a folded towel. 3. Gently tilt your head back and press into the soft object. Keep your jaw and forehead relaxed. 4. Hold for ____15______ seconds. 5. Release the tension slowly. Relax your neck muscles completely before you repeat this exercise. Repeat ___3_______ times. Complete this exercise _____2_____ times a day. Posture and body mechanics Body mechanics refers to the movements and positions of your  body while you do your daily activities. Posture is part of body mechanics. Good posture and healthy body mechanics can help to relieve stress in your body's tissues and joints. Good posture means that your spine is in its natural S-curve position (your spine is neutral), your shoulders are pulled back slightly, and your head is not tipped forward. The following are general guidelines for applying improved posture and  body mechanics to your everyday activities. Standing   When standing, keep your spine neutral and keep your feet about hip-width apart. Keep a slight bend in your knees. Your ears, shoulders, and hips should line up.  When you do a task in which you stand in one place for a long time, place one foot up on a stable object that is 2-4 inches (5-10 cm) high, such as a footstool. This helps keep your spine neutral. Sitting   When sitting, keep your spine neutral and your keep feet flat on the floor. Use a footrest, if necessary, and keep your thighs parallel to the floor. Avoid rounding your shoulders, and avoid tilting your head forward.  When working at a desk or a computer, keep your desk at a height where your hands are slightly lower than your elbows. Slide your chair under your desk so you are close enough to maintain good posture.  When working at a computer, place your monitor at a height where you are looking straight ahead and you do not have to tilt your head forward or downward to look at the screen. Resting When lying down and resting, avoid positions that are most painful for you. Try to support your neck in a neutral position. You can use a contour pillow or a small rolled-up towel. Your pillow should support your neck but not push on it. This information is not intended to replace advice given to you by your health care provider. Make sure you discuss any questions you have with your health care provider. Document Released: 12/08/2005 Document Revised: 08/14/2016 Document Reviewed: 11/14/2015 [employer] Interactive Patient Education  2019 [employer] Muscle Strain A muscle strain is an injury that occurs when a muscle is stretched beyond its normal length. Usually, a small number of muscle fibers are torn when this happens. There are three types of muscle strains. First-degree strains have the least amount of muscle fiber tearing and the least amount of pain. Second-degree and  third-degree strains have more tearing and pain. Usually, recovery from muscle strain takes 1-2 weeks. Complete healing normally takes 5-6 weeks. What are the causes? This condition is caused when a sudden, violent force is placed on a muscle and stretches it too far. This may occur with a fall, lifting, or sports. What increases the risk? This condition is more likely to develop in athletes and people who are physically active. What are the signs or symptoms? Symptoms of this condition include:  Pain.  Bruising.  Swelling.  Trouble using the muscle. How is this diagnosed? This condition is diagnosed based on a physical exam and your medical history. Tests may also be done, including an X-ray, ultrasound, or MRI. How is this treated? This condition is initially treated with PRICE therapy. This therapy involves:  Protecting the muscle from being injured again.  Resting the injured muscle.  Icing the injured muscle.  Applying pressure (compression) to the injured muscle. This may be done with a splint or elastic bandage.  Raising (elevating) the injured muscle. Your health care provider may also recommend medicine for pain.  Follow these instructions at home: If you have a splint:  Wear the splint as told by your health care provider. Remove it only as told by your health care provider.  Loosen the splint if your fingers or toes tingle, become numb, or turn cold and blue.  Keep the splint clean.  If the splint is not waterproof: ? Do not let it get wet. ? Cover it with a watertight covering when you take a bath or a shower. Managing pain, stiffness, and swelling   If directed, put ice on the injured area. ? If you have a removable splint, remove it as told by your health care provider. ? Put ice in a plastic bag. ? Place a towel between your skin and the bag. ? Leave the ice on for 20 minutes, 2-3 times a day.  Move your fingers or toes often to avoid stiffness and to  lessen swelling.  Raise (elevate) the injured area above the level of your heart while you are sitting or lying down.  Wear an elastic bandage as told by your health care provider. Make sure that it is not too tight. General instructions  Take over-the-counter and prescription medicines only as told by your health care provider.  Restrict your activity and rest the injured muscle as told by your health care provider. Gentle movements may be allowed.  If physical therapy was prescribed, do exercises as told by your health care provider.  Do not put pressure on any part of the splint until it is fully hardened. This may take several hours.  Do not use any products that contain nicotine or tobacco, such as cigarettes and e-cigarettes. These can delay bone healing. If you need help quitting, ask your health care provider.  Ask your health care provider when it is safe to drive if you have a splint.  Keep all follow-up visits as told by your health care provider. This is important. How is this prevented?  Warm up before exercising. This helps to prevent future muscle strains. Contact a health care provider if:  You have more pain or swelling in the injured area. Get help right away if:  You have numbness or tingling or lose a lot of strength in the injured area. Summary  A muscle strain is an injury that occurs when a muscle is stretched beyond its normal length.  This condition is caused when a sudden, violent force is placed on a muscle and stretches it too far.  This condition is initially treated with PRICE therapy, which involves protecting, resting, icing, compressing, and elevating.  Gentle movements may be allowed. If physical therapy was prescribed, do exercises as told by your health care provider. This information is not intended to replace advice given to you by your health care provider. Make sure you discuss any questions you have with your health care  provider. Document Released: 12/08/2005 Document Revised: 01/14/2017 Document Reviewed: 01/14/2017 [employer] Interactive Patient Education  2019 [employer] Cervical Strain and Sprain Rehab Ask your health care provider which exercises are safe for you. Do exercises exactly as told by your health care provider and adjust them as directed. It is normal to feel mild stretching, pulling, tightness, or discomfort as you do these exercises, but you should stop right away if you feel sudden pain or your pain gets worse.Do not begin these exercises until told by your health care provider. Stretching and range of motion exercises These exercises warm up your muscles and joints and improve the  movement and flexibility of your neck. These exercises also help to relieve pain, numbness, and tingling. Exercise A: Cervical side bend  5. Using good posture, sit on a stable chair or stand up. 6. Without moving your shoulders, slowly tilt your left / right ear to your shoulder until you feel a stretch in your neck muscles. You should be looking straight ahead. 7. Hold for __________ seconds. 8. Repeat with the other side of your neck. Repeat __________ times. Complete this exercise __________ times a day. Exercise B: Cervical rotation  5. Using good posture, sit on a stable chair or stand up. 6. Slowly turn your head to the side as if you are looking over your left / right shoulder. ? Keep your eyes level with the ground. ? Stop when you feel a stretch along the side and the back of your neck. 7. Hold for __________ seconds. 8. Repeat this by turning to your other side. Repeat __________ times. Complete this exercise __________ times a day. Exercise C: Thoracic extension and pectoral stretch 6. Roll a towel or a small blanket so it is about 4 inches (10 cm) in diameter. 7. Lie down on your back on a firm surface. 8. Put the towel lengthwise, under your spine in the middle of your back. It should not be  not under your shoulder blades. The towel should line up with your spine from your middle back to your lower back. 9. Put your hands behind your head and let your elbows fall out to your sides. 10. Hold for __________ seconds. Repeat __________ times. Complete this exercise __________ times a day. Strengthening exercises These exercises build strength and endurance in your neck. Endurance is the ability to use your muscles for a long time, even after your muscles get tired. Exercise D: Upper cervical flexion, isometric 5. Lie on your back with a thin pillow behind your head and a small rolled-up towel under your neck. 6. Gently tuck your chin toward your chest and nod your head down to look toward your feet. Do not lift your head off the pillow. 7. Hold for __________ seconds. 8. Release the tension slowly. Relax your neck muscles completely before you repeat this exercise. Repeat __________ times. Complete this exercise __________ times a day. Exercise E: Cervical extension, isometric  6. Stand about 6 inches (15 cm) away from a wall, with your back facing the wall. 7. Place a soft object, about 6-8 inches (15-20 cm) in diameter, between the back of your head and the wall. A soft object could be a small pillow, a ball, or a folded towel. 8. Gently tilt your head back and press into the soft object. Keep your jaw and forehead relaxed. 9. Hold for __________ seconds. 10. Release the tension slowly. Relax your neck muscles completely before you repeat this exercise. Repeat __________ times. Complete this exercise __________ times a day. Posture and body mechanics Body mechanics refers to the movements and positions of your body while you do your daily activities. Posture is part of body mechanics. Good posture and healthy body mechanics can help to relieve stress in your body's tissues and joints. Good posture means that your spine is in its natural S-curve position (your spine is neutral), your  shoulders are pulled back slightly, and your head is not tipped forward. The following are general guidelines for applying improved posture and body mechanics to your everyday activities. Standing   When standing, keep your spine neutral and keep your feet about hip-width  apart. Keep a slight bend in your knees. Your ears, shoulders, and hips should line up.  When you do a task in which you stand in one place for a long time, place one foot up on a stable object that is 2-4 inches (5-10 cm) high, such as a footstool. This helps keep your spine neutral. Sitting   When sitting, keep your spine neutral and your keep feet flat on the floor. Use a footrest, if necessary, and keep your thighs parallel to the floor. Avoid rounding your shoulders, and avoid tilting your head forward.  When working at a desk or a computer, keep your desk at a height where your hands are slightly lower than your elbows. Slide your chair under your desk so you are close enough to maintain good posture.  When working at a computer, place your monitor at a height where you are looking straight ahead and you do not have to tilt your head forward or downward to look at the screen. Resting When lying down and resting, avoid positions that are most painful for you. Try to support your neck in a neutral position. You can use a contour pillow or a small rolled-up towel. Your pillow should support your neck but not push on it. This information is not intended to replace advice given to you by your health care provider. Make sure you discuss any questions you have with your health care provider. Document Released: 12/08/2005 Document Revised: 08/14/2016 Document Reviewed: 11/14/2015 [employer] Interactive Patient Education  2019 [employer].

## 2019-05-17 NOTE — Progress Notes (Signed)
Subjective:    Patient ID: Shari Shields, female    DOB: 02-26-1976, 43 y.o.   MRN: [id number]  43y/o established African-American female pt c/o R mid anterior chest wall pain that is easily reproducible with lifting, pushing, and/or pulling. Pain starts in R chest and extends over R shoulder. Not associated ShOB, chest pressure, dizziness, diaphoresis, fever, chills, rash, nausea, vomiting, diarrhea, cough, localized swelling or dent on chest. Pain has occurred before but not frequently. 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 Had Mobic refilled 5/24 with intention to take for chest/shoulder pain but has not picked up yet.  Stopped taking naproxen because it hurt her stomach.  Hasn't required baclofen lately and ran out of rx from [facility].  Right hand dominant.  She has also been taking tylenol arthritis 2 tabs po prn typically twice a day.  Has used flexeril before for her back works well but has run out of pills at home also.  Denied loss of bowel/bladder control or arm/leg weakness.     Review of Systems  Constitutional: Negative for activity change, appetite change, chills, diaphoresis, fatigue and fever.  HENT: Negative for trouble swallowing and voice change.   Eyes: Negative for photophobia and visual disturbance.  Respiratory: Negative for cough, choking, shortness of breath, wheezing and stridor.   Gastrointestinal: Negative for abdominal pain, diarrhea, nausea and vomiting.  Endocrine: Negative for cold intolerance and heat intolerance.  Genitourinary: Negative for difficulty urinating and enuresis.  Musculoskeletal: Positive for myalgias. Negative for arthralgias, back pain, gait problem, joint swelling, neck pain and neck stiffness.  Skin: Negative for color change, pallor, rash and wound.  Allergic/Immunologic: Positive for environmental allergies. Negative for food allergies  and immunocompromised state.  Neurological: Negative for dizziness, tremors, seizures, syncope, facial asymmetry, speech difficulty, weakness, light-headedness, numbness and headaches.  Hematological: Negative for adenopathy. Does not bruise/bleed easily.  Psychiatric/Behavioral: Negative for agitation, confusion and sleep disturbance.       Objective:   Physical Exam Vitals signs and nursing note reviewed.  Constitutional:      General: She is awake. She is not in acute distress.    Appearance: Normal appearance. She is well-developed and well-groomed. She is obese. She is not ill-appearing, toxic-appearing or diaphoretic.  HENT:     Head: Normocephalic and atraumatic.     Jaw: There is normal jaw occlusion.     Salivary Glands: Right salivary gland is not diffusely enlarged or tender. Left salivary gland is not diffusely enlarged or tender.     Right Ear: Hearing and external ear normal.     Left Ear: Hearing and external ear normal.     Nose: Nose normal.     Right Sinus: No maxillary sinus tenderness or frontal sinus tenderness.     Left Sinus: No maxillary sinus tenderness or frontal sinus tenderness.     Mouth/Throat:     Lips: Pink. No lesions.     Mouth: Mucous membranes are moist.     Pharynx: Uvula midline. No oropharyngeal exudate.  Eyes:     General: Lids are normal. Allergic shiner present. No visual field deficit or scleral icterus.       Right eye: No discharge.        Left eye: No discharge.     Extraocular Movements: Extraocular movements intact.     Conjunctiva/sclera: Conjunctivae normal.     Pupils: Pupils  are equal, round, and reactive to light.  Neck:     Musculoskeletal: Neck supple. Normal range of motion. Crepitus present. No edema, erythema, neck rigidity, injury, pain with movement, torticollis, spinous process tenderness or muscular tenderness.     Thyroid: No thyromegaly.     Vascular: No JVD.     Trachea: Trachea and phonation normal. No tracheal  tenderness, tracheostomy, abnormal tracheal secretions or tracheal deviation.     Comments: Bilateral trapezius very tight along with cervical paraspinals C3-7 not tender; applied thermacare cervical from clinic stock to C4-C7 Cardiovascular:     Rate and Rhythm: Normal rate and regular rhythm.     Pulses: Normal pulses.          Radial pulses are 2+ on the right side and 2+ on the left side.     Heart sounds: Normal heart sounds. No murmur.  Pulmonary:     Effort: Pulmonary effort is normal. No respiratory distress.     Breath sounds: Normal breath sounds and air entry. No stridor, decreased air movement or transmitted upper airway sounds. No decreased breath sounds, wheezing, rhonchi or rales.     Comments: No cough observed in exam room; spoke full sentences without difficulty Chest:     Chest wall: No mass, lacerations, deformity, swelling, tenderness, crepitus or edema. There is no dullness to percussion.       Comments: No defect or swelling or rash/erythema/bruising noted right anterior chest Abdominal:     Palpations: Abdomen is soft.     Tenderness: There is no abdominal tenderness.  Musculoskeletal:        General: Signs of injury present. No swelling, tenderness or deformity.     Right shoulder: Normal.     Left shoulder: Normal.     Right elbow: Normal.    Left elbow: Normal.     Right hip: Normal.     Left hip: She exhibits normal range of motion, normal strength, no bony tenderness, no swelling, no crepitus, no deformity and no laceration.     Right knee: Normal.     Left knee: Normal.     Right ankle: Normal.     Left ankle: Normal.     Cervical back: Normal.     Thoracic back: Normal.     Lumbar back: She exhibits normal range of motion, no tenderness, no bony tenderness, no swelling, no edema, no deformity, no laceration, no pain, no spasm and normal pulse.     Right hand: Normal.     Left hand: Normal.     Right upper leg: Normal.     Left upper leg: She  exhibits no bony tenderness, no swelling, no edema, no deformity and no laceration.     Right lower leg: Normal. No edema.     Left lower leg: Normal. No edema.     Comments: Negative NEERS/empty beer can/internal/external rotation bilateral shoulders and full AROM without pain; full c/t-spine AROM  Lymphadenopathy:     Head:     Right side of head: No submental, submandibular, tonsillar, preauricular, posterior auricular or occipital adenopathy.     Left side of head: No submental, submandibular, tonsillar, preauricular, posterior auricular or occipital adenopathy.     Cervical: No cervical adenopathy.     Right cervical: No superficial, deep or posterior cervical adenopathy.    Left cervical: No superficial, deep or posterior cervical adenopathy.     Upper Body:     Right upper body: No supraclavicular or pectoral adenopathy.  Skin:    General: Skin is warm and dry.     Capillary Refill: Capillary refill takes less than 2 seconds.     Coloration: Skin is not ashen, cyanotic, jaundiced, mottled, pale or sallow.     Findings: No abrasion, abscess, acne, bruising, burn, ecchymosis, erythema, signs of injury, laceration, lesion, petechiae, rash or wound.     Nails: There is no clubbing.   Neurological:     General: No focal deficit present.     Mental Status: She is alert and oriented to person, place, and time. Mental status is at baseline. She is not disoriented.     GCS: GCS eye subscore is 4. GCS verbal subscore is 5. GCS motor subscore is 6.     Cranial Nerves: Cranial nerves are intact. No cranial nerve deficit, dysarthria or facial asymmetry.     Sensory: Sensation is intact. No sensory deficit.     Motor: Motor function is intact. No weakness, tremor, atrophy, abnormal muscle tone or seizure activity.     Coordination: Coordination is intact. Coordination normal.     Gait: Gait is intact. Gait normal.     Deep Tendon Reflexes: Reflexes normal.     Comments: Strength 5/5  bilaterally arms/legs; on/off exam table and in/out of chair without difficulty  Psychiatric:        Attention and Perception: Attention and perception normal. She is attentive.        Mood and Affect: Mood and affect normal.        Speech: Speech normal.        Behavior: Behavior normal. Behavior is cooperative.        Thought Content: Thought content normal.        Cognition and Memory: Cognition and memory normal.        Judgment: Judgment normal.           Assessment & Plan:  A-muscle spasms neck and pectoralis major strain acute initial visit right  P-cyclobenazeprine/flexeril 5-10mg  po TID prn muscle spasms #30 RF0 dispensed from [facility].  meloxicam 7.5mg  po daily.  Avoid Ibuprofen/advil/aleve/naproxen/naprosyn.  Avoid alcohol intake and driving while taking cyclobenazeprine/flexeril as drowsiness common side effect.  Slow position changes as medication also lower blood pressure.  Home stretches demonstrated to patient-e.g. Arm circles, walking up wall, chest stretches, neck AROM, chin tucks.  biofreeze gel may apply QID 1 package to area of pain/skin given 4 UD from clinic stock.  Given 4 UD tylenol 1000mg  po QID.  Discussed typically tylenol arthritis dose 650mg  per tab max 1000mg  tylenol per dose and 4gm per 24 hours. Self massage or professional prn, foam roller use or tennis/racquetball.  Heat/cryotherapy 15 minutes QID prn.  Trial thermacare 1 applied in clinic from clinic stock.  Consider physical therapy referral if no improvement with prescribed therapy from [facility] and/or chiropractic care.  Ensure ergonomics correct desk at work avoid repetitive motions if possible/holding phone/laptop in hand use desk/stand and/or break up lifting items into smaller loads/weights.  Patient was instructed to rest, ice, and ROM exercises.  Activity as tolerated.   Follow up if symptoms persist or worsen especially if loss of bowel/bladder control, arm/leg weakness and/or saddle paresthesias.  [facility]  handout on chest muscle strain,  muscle spasms and cervical strain rehab exercises given to patient.  Follow up in 2 weeks for re-evaluation if pain not resolved.  Patient verbalized agreement and understanding of treatment plan and had no further questions at this time.  P2:  Injury Prevention  and Fitness.

## 2019-09-29 ENCOUNTER — Ambulatory Visit: Payer: Self-pay | Admitting: Registered Nurse

## 2019-09-29 ENCOUNTER — Encounter: Payer: Self-pay | Admitting: Registered Nurse

## 2019-09-29 ENCOUNTER — Other Ambulatory Visit: Payer: Self-pay

## 2019-09-29 VITALS — BP 139/97 | HR 76 | Temp 99.3°F

## 2019-09-29 DIAGNOSIS — J301 Allergic rhinitis due to pollen: Secondary | ICD-10-CM

## 2019-09-29 DIAGNOSIS — H65191 Other acute nonsuppurative otitis media, right ear: Secondary | ICD-10-CM

## 2019-09-29 MED ORDER — MONTELUKAST SODIUM 10 MG PO TABS: 10.0000 mg | ORAL_TABLET | Freq: Every day | ORAL | 3 refills | Status: DC

## 2019-09-29 MED ORDER — AMOXICILLIN-POT CLAVULANATE 875-125 MG PO TABS: 1.0000 | ORAL_TABLET | Freq: Two times a day (BID) | ORAL | 0 refills | Status: AC

## 2019-09-29 MED ORDER — ACETAMINOPHEN 500 MG PO TABS: 1000.0000 mg | ORAL_TABLET | Freq: Four times a day (QID) | ORAL | 0 refills | Status: AC | PRN

## 2019-09-29 NOTE — Patient Instructions (Signed)
How to Perform a Sinus Rinse A sinus rinse is a home treatment that is used to rinse your sinuses with a sterile mixture of salt and water (saline solution). Sinuses are air-filled spaces in your skull behind the bones of your face and forehead that open into your nasal cavity. A sinus rinse can help to clear mucus, dirt, dust, or pollen from your nasal cavity. You may do a sinus rinse when you have a cold, a virus, nasal allergy symptoms, a sinus infection, or stuffiness in your nose or sinuses. Talk with your health care provider about whether a sinus rinse might help you. What are the risks? A sinus rinse is generally safe and effective. However, there are a few risks, which include:  A burning sensation in your sinuses. This may happen if you do not make the saline solution as directed. Be sure to follow all directions when making the saline solution.  Nasal irritation.  Infection from contaminated water. This is rare, but possible. Do not do a sinus rinse if you have had ear or nasal surgery, ear infection, or blocked ears. Supplies needed:  Saline solution or powder.  Distilled or sterile water may be needed to mix with saline powder. ? You may use boiled and cooled tap water. Boil tap water for 5 minutes; cool until it is lukewarm. Use within 24 hours. ? Do not use regular tap water to mix with the saline solution.  Neti pot or nasal rinse bottle. These supplies release the saline solution into your nose and through your sinuses. Neti pots and nasal rinse bottles can be purchased at [employer], a health food store, or online. How to perform a sinus rinse  1. Wash your hands with soap and water. 2. Wash your device according to the directions that came with the product and then dry it. 3. Use the solution that comes with your product or one that is sold separately in stores. Follow the mixing directions on the package if you need to mix with sterile or distilled water. 4.  Fill the device with the amount of saline solution noted in the device instructions. 5. Stand over a sink and tilt your head sideways over the sink. 6. Place the spout of the device in your upper nostril (the one closer to the ceiling). 7. Gently pour or squeeze the saline solution into your nasal cavity. The liquid should drain out from the lower nostril if you are not too congested. 8. While rinsing, breathe through your open mouth. 9. Gently blow your nose to clear any mucus and rinse solution. Blowing too hard may cause ear pain. 10. Repeat in your other nostril. 11. Clean and rinse your device with clean water and then air-dry it. Talk with your health care provider or pharmacist if you have questions about how to do a sinus rinse. Summary  A sinus rinse is a home treatment that is used to rinse your sinuses with a sterile mixture of salt and water (saline solution).  A sinus rinse is generally safe and effective. Follow all instructions carefully.  Before doing a sinus rinse, talk with your health care provider about whether it would be helpful for you. This information is not intended to replace advice given to you by your health care provider. Make sure you discuss any questions you have with your health care provider. Document Released: 07/05/2014 Document Revised: 10/05/2017 Document Reviewed: 10/05/2017 Elsevier Patient Education  2020 [employer]. Allergic Rhinitis, Adult Allergic rhinitis is  an allergic reaction that affects the mucous membrane inside the nose. It causes sneezing, a runny or stuffy nose, and the feeling of mucus going down the back of the throat (postnasal drip). Allergic rhinitis can be mild to severe. There are two types of allergic rhinitis:  Seasonal. This type is also called hay fever. It happens only during certain seasons.  Perennial. This type can happen at any time of the year. What are the causes? This condition happens when the body's defense  system (immune system) responds to certain harmless substances called allergens as though they were germs.  Seasonal allergic rhinitis is triggered by pollen, which can come from grasses, trees, and weeds. Perennial allergic rhinitis may be caused by:  House dust mites.  Pet dander.  Mold spores. What are the signs or symptoms? Symptoms of this condition include:  Sneezing.  Runny or stuffy nose (nasal congestion).  Postnasal drip.  Itchy nose.  Tearing of the eyes.  Trouble sleeping.  Daytime sleepiness. How is this diagnosed? This condition may be diagnosed based on:  Your medical history.  A physical exam.  Tests to check for related conditions, such as: ? Asthma. ? Pink eye. ? Ear infection. ? Upper respiratory infection.  Tests to find out which allergens trigger your symptoms. These may include skin or blood tests. How is this treated? There is no cure for this condition, but treatment can help control symptoms. Treatment may include:  Taking medicines that block allergy symptoms, such as antihistamines. Medicine may be given as a shot, nasal spray, or pill.  Avoiding the allergen.  Desensitization. This treatment involves getting ongoing shots until your body becomes less sensitive to the allergen. This treatment may be done if other treatments do not help.  If taking medicine and avoiding the allergen does not work, new, stronger medicines may be prescribed. Follow these instructions at home:  Find out what you are allergic to. Common allergens include smoke, dust, and pollen.  Avoid the things you are allergic to. These are some things you can do to help avoid allergens: ? Replace carpet with wood, tile, or vinyl flooring. Carpet can trap dander and dust. ? Do not smoke. Do not allow smoking in your home. ? Change your heating and air conditioning filter at least once a month. ? During allergy season:  Keep windows closed as much as possible.  Plan  outdoor activities when pollen counts are lowest. This is usually during the evening hours.  When coming indoors, change clothing and shower before sitting on furniture or bedding.  Take over-the-counter and prescription medicines only as told by your health care provider.  Keep all follow-up visits as told by your health care provider. This is important. Contact a health care provider if:  You have a fever.  You develop a persistent cough.  You make whistling sounds when you breathe (you wheeze).  Your symptoms interfere with your normal daily activities. Get help right away if:  You have shortness of breath. Summary  This condition can be managed by taking medicines as directed and avoiding allergens.  Contact your health care provider if you develop a persistent cough or fever.  During allergy season, keep windows closed as much as possible. This information is not intended to replace advice given to you by your health care provider. Make sure you discuss any questions you have with your health care provider. Document Released: 09/02/2001 Document Revised: 11/20/2017 Document Reviewed: 01/15/2017 Elsevier Patient Education  2020 [employer]  Otitis Media, Adult  Otitis media occurs when there is inflammation and fluid in the middle ear. Your middle ear is a part of the ear that contains bones for hearing as well as air that helps send sounds to your brain. What are the causes? This condition is caused by a blockage in the eustachian tube. This tube drains fluid from the ear to the back of the nose (nasopharynx). A blockage in this tube can be caused by an object or by swelling (edema) in the tube. Problems that can cause a blockage include:  A cold or other upper respiratory infection.  Allergies.  An irritant, such as tobacco smoke.  Enlarged adenoids. The adenoids are areas of soft tissue located high in the back of the throat, behind the nose and the roof of the  mouth.  A mass in the nasopharynx.  Damage to the ear caused by pressure changes (barotrauma). What are the signs or symptoms? Symptoms of this condition include:  Ear pain.  A fever.  Decreased hearing.  A headache.  Tiredness (lethargy).  Fluid leaking from the ear.  Ringing in the ear. How is this diagnosed? This condition is diagnosed with a physical exam. During the exam your health care provider will use an instrument called an otoscope to look into your ear and check for redness, swelling, and fluid. He or she will also ask about your symptoms. Your health care provider may also order tests, such as:  A test to check the movement of the eardrum (pneumatic otoscopy). This test is done by squeezing a small amount of air into the ear.  A test that changes air pressure in the middle ear to check how well the eardrum moves and whether the eustachian tube is working (tympanogram). How is this treated? This condition usually goes away on its own within 3-5 days. But if the condition is caused by a bacteria infection and does not go away own its own, or keeps coming back, your health care provider may:  Prescribe antibiotic medicines to treat the infection.  Prescribe or recommend medicines to control pain. Follow these instructions at home:  Take over-the-counter and prescription medicines only as told by your health care provider.  If you were prescribed an antibiotic medicine, take it as told by your health care provider. Do not stop taking the antibiotic even if you start to feel better.  Keep all follow-up visits as told by your health care provider. This is important. Contact a health care provider if:  You have bleeding from your nose.  There is a lump on your neck.  You are not getting better in 5 days.  You feel worse instead of better. Get help right away if:  You have severe pain that is not controlled with medicine.  You have swelling, redness, or pain  around your ear.  You have stiffness in your neck.  A part of your face is paralyzed.  The bone behind your ear (mastoid) is tender when you touch it.  You develop a severe headache. Summary  Otitis media is redness, soreness, and swelling of the middle ear.  This condition usually goes away on its own within 3-5 days.  If the problem does not go away in 3-5 days, your health care provider may prescribe or recommend medicines to treat your symptoms.  If you were prescribed an antibiotic medicine, take it as told by your health care provider. This information is not intended to replace advice given to you  by your health care provider. Make sure you discuss any questions you have with your health care provider. Document Released: 09/12/2004 Document Revised: 11/20/2017 Document Reviewed: 11/28/2016 Elsevier Patient Education  2020 [employer].

## 2019-09-29 NOTE — Progress Notes (Signed)
Subjective:    Patient ID: Shari Shields, female    DOB: October 21, 1976, 43 y.o.   MRN: [id number]  43y/o African American established female pt c/o R ear pain x1 week. States it is a dull constant ache. Denies ear drainage, muffled sounds, sinus pain/pressure, nasal or chest congestion, cough, HA. States mild pain in front of both ears occasionally. Using Claritin, Flonase, nasal saline spray at home. States she usually gets an ear infection each year in spring and around this time of year.  Last augmentin use per paper chart review dispensed from [facility] Feb 16, 2019.  Patient reported she has noted post nasal drip and fall allergy flare.  Wearing cloth mask at work due to covid 19 pandemic.     Review of Systems  Constitutional: Negative for activity change, appetite change, chills, diaphoresis, fatigue, fever and unexpected weight change.  HENT: Positive for ear pain, postnasal drip and sinus pressure. Negative for congestion, dental problem, drooling, ear discharge, facial swelling, hearing loss, mouth sores, nosebleeds, rhinorrhea, sinus pain, sneezing, sore throat, tinnitus, trouble swallowing and voice change.   Eyes: Negative for photophobia, pain, discharge, redness, itching and visual disturbance.  Respiratory: Negative for cough, choking, chest tightness, shortness of breath, wheezing and stridor.   Cardiovascular: Negative for chest pain, palpitations and leg swelling.  Gastrointestinal: Negative for abdominal distention, abdominal pain, blood in stool, constipation, diarrhea, nausea and vomiting.  Endocrine: Negative for cold intolerance and heat intolerance.  Genitourinary: Negative for difficulty urinating, dysuria and hematuria.  Musculoskeletal: Negative for arthralgias, back pain, gait problem, joint swelling, myalgias, neck pain and neck stiffness.  Skin: Negative for color change, pallor, rash and wound.  Allergic/Immunologic: Positive for environmental allergies. Negative for  food allergies.  Neurological: Negative for dizziness, tremors, seizures, syncope, facial asymmetry, speech difficulty, weakness, light-headedness, numbness and headaches.  Hematological: Negative for adenopathy. Does not bruise/bleed easily.  Psychiatric/Behavioral: Negative for agitation, behavioral problems, confusion and sleep disturbance.       Objective:   Physical Exam Vitals signs and nursing note reviewed.  Constitutional:      General: She is awake. She is not in acute distress.    Appearance: Normal appearance. She is well-developed and well-groomed. She is obese. She is not ill-appearing, toxic-appearing or diaphoretic.  HENT:     Head: Normocephalic and atraumatic. No raccoon eyes, right periorbital erythema or left periorbital erythema.     Jaw: There is normal jaw occlusion. No trismus, tenderness, swelling, pain on movement or malocclusion.     Salivary Glands: Right salivary gland is not diffusely enlarged or tender. Left salivary gland is not diffusely enlarged or tender.     Right Ear: Hearing, ear canal and external ear normal. A middle ear effusion is present. There is no impacted cerumen. Tympanic membrane is erythematous and bulging.     Left Ear: Hearing, ear canal and external ear normal. A middle ear effusion is present. There is no impacted cerumen. Tympanic membrane is bulging.     Ears:     Comments: Left TM bulging air fluid level slight opacity; right TM with erythema most pronounced 8-12 oclock air fluid level clear; cobblestoning posterior pharynx; bilateral allergic shiners    Nose: No nasal deformity, septal deviation, laceration, mucosal edema, congestion or rhinorrhea.     Right Sinus: Maxillary sinus tenderness and frontal sinus tenderness present.     Left Sinus: Maxillary sinus tenderness and frontal sinus tenderness present.     Mouth/Throat:     Lips:  Pink. No lesions.     Mouth: Mucous membranes are moist. Mucous membranes are not pale, not dry and  not cyanotic. No lacerations, oral lesions or angioedema.     Dentition: Normal dentition. Does not have dentures. No dental tenderness, gingival swelling, dental caries, dental abscesses or gum lesions.     Tongue: No lesions. Tongue does not deviate from midline.     Palate: No mass and lesions.     Pharynx: Uvula midline. Pharyngeal swelling and posterior oropharyngeal erythema present. No oropharyngeal exudate or uvula swelling.     Tonsils: No tonsillar exudate or tonsillar abscesses.  Eyes:     General: Lids are normal. Vision grossly intact. Gaze aligned appropriately. Allergic shiner present. No visual field deficit or scleral icterus.       Right eye: No foreign body, discharge or hordeolum.        Left eye: No foreign body, discharge or hordeolum.     Extraocular Movements:     Right eye: Normal extraocular motion and no nystagmus.     Left eye: Normal extraocular motion and no nystagmus.     Conjunctiva/sclera: Conjunctivae normal.     Right eye: Right conjunctiva is not injected. No chemosis, exudate or hemorrhage.    Left eye: Left conjunctiva is not injected. No chemosis, exudate or hemorrhage.    Pupils: Pupils are equal, round, and reactive to light. Pupils are equal.     Right eye: Pupil is round and reactive.     Left eye: Pupil is round and reactive.  Neck:     Musculoskeletal: Normal range of motion and neck supple. Normal range of motion. No edema, erythema, neck rigidity, crepitus, injury, pain with movement, torticollis, spinous process tenderness or muscular tenderness.     Thyroid: No thyroid mass or thyromegaly.     Trachea: Trachea normal. No tracheal tenderness or tracheal deviation.  Cardiovascular:     Rate and Rhythm: Normal rate and regular rhythm.     Chest Wall: PMI is not displaced.     Pulses: Normal pulses.          Radial pulses are 2+ on the right side and 2+ on the left side.     Heart sounds: Normal heart sounds, S1 normal and S2 normal. No murmur.  No friction rub. No gallop.   Pulmonary:     Effort: Pulmonary effort is normal. No accessory muscle usage or respiratory distress.     Breath sounds: Normal breath sounds and air entry. No stridor, decreased air movement or transmitted upper airway sounds. No decreased breath sounds, wheezing, rhonchi or rales.     Comments: No cough observed in exam room; spoke full sentences without difficulty Chest:     Chest wall: No tenderness.  Abdominal:     General: There is no distension.     Palpations: Abdomen is soft.  Musculoskeletal: Normal range of motion.        General: No tenderness.     Right shoulder: Normal.     Left shoulder: Normal.     Right elbow: Normal.    Left elbow: Normal.     Right hip: Normal.     Left hip: Normal.     Right knee: Normal.     Left knee: Normal.     Cervical back: Normal.     Thoracic back: Normal.     Lumbar back: Normal.     Right hand: Normal.     Left hand: Normal.  Lymphadenopathy:  Head:     Right side of head: No submental, submandibular, tonsillar, preauricular, posterior auricular or occipital adenopathy.     Left side of head: No submental, submandibular, tonsillar, preauricular, posterior auricular or occipital adenopathy.     Cervical: No cervical adenopathy.     Right cervical: No superficial, deep or posterior cervical adenopathy.    Left cervical: No superficial, deep or posterior cervical adenopathy.  Skin:    General: Skin is warm and dry.     Capillary Refill: Capillary refill takes less than 2 seconds.     Coloration: Skin is not ashen, cyanotic, jaundiced, mottled, pale or sallow.     Findings: No abrasion, abscess, acne, bruising, burn, ecchymosis, erythema, signs of injury, laceration, lesion, petechiae, rash or wound.     Nails: There is no clubbing.   Neurological:     General: No focal deficit present.     Mental Status: She is alert and oriented to person, place, and time. Mental status is at baseline. She is not  disoriented.     GCS: GCS eye subscore is 4. GCS verbal subscore is 5. GCS motor subscore is 6.     Cranial Nerves: Cranial nerves are intact. No cranial nerve deficit, dysarthria or facial asymmetry.     Sensory: Sensation is intact. No sensory deficit.     Motor: Motor function is intact. No weakness, tremor, atrophy, abnormal muscle tone or seizure activity.     Coordination: Coordination is intact. Coordination normal.     Gait: Gait is intact. Gait normal.  Psychiatric:        Attention and Perception: Attention and perception normal.        Mood and Affect: Mood and affect normal.        Speech: Speech normal.        Behavior: Behavior normal. Behavior is cooperative.        Thought Content: Thought content normal.        Cognition and Memory: Cognition and memory normal.        Judgment: Judgment normal.           Assessment & Plan:  A-acute right otitis media nonsupportive and seasonal allergic rhinitis due to pollen  P-Supportive treatment. Augmentin 875mg  po BID x 10 days #20 RF0 dispensed from [facility] to patient  Tylenol 1000mg  po QID prn pain/fever.  Avoid OTC NSAIDS as patient on meloxicam.   No evidence of invasive bacterial infection, non toxic and well hydrated.  This is most likely self limiting viral infection.  I do not see where any further testing or imaging is necessary at this time.   I will suggest supportive care, rest, good hygiene and encourage the patient to take adequate fluids.  The patient is to return to clinic or EMERGENCY ROOM if symptoms worsen or change significantly e.g. ear pain, fever, purulent discharge from ears or bleeding.  Exitcare handout on otitis media   Patient verbalized agreement and understanding of treatment plan.    Antihistamine, nasal saline and flonase not controlling post nasal drip and having sinus symptoms.  Will add trial singulair 10mg  po qhs #30 Rf3.  Ensure washing cloth mask and changing every day and prn soiling.   Discussed  black box warning with patient regarding mood changes and to notify me if these start.  Patient may use normal saline nasal spray 2 sprays each nostril q2h wa as needed. flonase 1 spray each nostril BID at home Patient denied personal or family  history of ENT cancer.  OTC antihistamine of choice claritin  po daily.  Avoid triggers if possible.  Shower prior to bedtime if exposed to triggers.  If allergic dust/dust mites recommend mattress/pillow covers/encasements; washing linens, vacuuming, sweeping, dusting weekly.  Call or return to clinic as needed if these symptoms worsen or fail to improve as anticipated.   Exitcare handout on allergic rhinitis and sinus rinse.  Patient verbalized understanding of instructions, agreed with plan of care and had no further questions at this time.  P2:  Avoidance and hand washing.

## 2020-02-12 ENCOUNTER — Encounter: Payer: Self-pay | Admitting: Registered Nurse

## 2020-02-12 ENCOUNTER — Telehealth: Payer: Self-pay | Admitting: Registered Nurse

## 2020-02-12 NOTE — Telephone Encounter (Signed)
HR Tim was contacted by patient as child was tested for covid at pediatrician office earlier this week and patient instructed to quarantine at home until PCR test results available.  Rapid test negative and PCR test results negative per patient today.  Patient denied signs or symptoms of covid to HR rep.  Negative covid test family member/denied symptoms cleared to return to work.  I recommended continue social distancing, wearing mask when out of home and frequent handwashing/hand sanitizing continue as cases in community decreasing but still above 5% positive test rate.  When eligible I do recommend covid vaccination also.

## 2020-02-13 NOTE — Telephone Encounter (Signed)
Noted. Pt's child negative. Pt with no sx. Eligible to RTW without testing or full quarantine period.

## 2020-08-07 ENCOUNTER — Encounter: Payer: Self-pay | Admitting: Registered Nurse

## 2020-08-07 ENCOUNTER — Other Ambulatory Visit: Payer: Self-pay

## 2020-08-07 ENCOUNTER — Telehealth: Payer: Self-pay | Admitting: Registered Nurse

## 2020-08-07 ENCOUNTER — Ambulatory Visit: Payer: Self-pay | Admitting: *Deleted

## 2020-08-07 DIAGNOSIS — S61219A Laceration without foreign body of unspecified finger without damage to nail, initial encounter: Secondary | ICD-10-CM

## 2020-08-07 DIAGNOSIS — Z23 Encounter for immunization: Secondary | ICD-10-CM

## 2020-08-07 NOTE — Progress Notes (Signed)
Pt cut palm of hand last night at home on metal food processor blade. Cleaned and dressed with steri-strips, tegaderm and guaze netting over hand. Last tetanus at least >12 years ago per pt, 1995 by Epic review. TDap administered today.

## 2020-08-07 NOTE — Telephone Encounter (Signed)
Patient cut finger at home was seen by RN Rolly Salter for wound care in clinic today.  Chart review conducted no tdap noted in [facility].  [facility] reports last tetanus vaccine 1995.  No vaccines noted in paper chart maintained at [facility] clinic.  Please verify with patient if she has had booster vaccine anywhere else otherwise give today im X 1 DOSE.

## 2020-08-09 NOTE — Telephone Encounter (Signed)
Noted agreed with plan of care and Rx signed. 

## 2020-08-09 NOTE — Telephone Encounter (Signed)
TDap given 08/07/20 in clinic as pt reported her last known was >12 years ago.

## 2021-01-01 ENCOUNTER — Encounter: Payer: Self-pay | Admitting: Registered Nurse

## 2021-01-01 ENCOUNTER — Telehealth: Payer: Self-pay | Admitting: Registered Nurse

## 2021-01-01 DIAGNOSIS — U071 COVID-19: Secondary | ICD-10-CM

## 2021-01-01 MED ORDER — AMOXICILLIN-POT CLAVULANATE 875-125 MG PO TABS: 1.0000 | ORAL_TABLET | Freq: Two times a day (BID) | ORAL | 0 refills | Status: DC

## 2021-01-01 MED ORDER — LORATADINE 10 MG PO TABS: 10.0000 mg | ORAL_TABLET | Freq: Every day | ORAL | 3 refills | Status: DC

## 2021-01-01 MED ORDER — FLUTICASONE PROPIONATE 50 MCG/ACT NA SUSP: 1.0000 | Freq: Two times a day (BID) | NASAL | 6 refills | Status: DC

## 2021-01-01 MED ORDER — MONTELUKAST SODIUM 10 MG PO TABS: 10.0000 mg | ORAL_TABLET | Freq: Every day | ORAL | 3 refills | Status: DC

## 2021-01-01 MED ORDER — SALINE SPRAY 0.65 % NA SOLN: 2.0000 | NASAL | 0 refills | Status: DC

## 2021-01-01 NOTE — Telephone Encounter (Signed)
HR manager Lincoln Maxin notified NP that pt reported symptoms fever and called in sick today 01/01/2021.  Patient instructed to start quarantine by HR and wait for clinic staff to call her with further instructions.    Patient contacted via telephone and stated fever Tmax 100 yesterday.  Vacinnated with pfizer #1 April and #22 Apr 2020 no booster.  Having cough, congestion nasal, ear pain and pain by her chin and sore throat on same side as ear hurting.  My typical symptoms with ear infection.  Reviewed epic and last antibiotics Oct 2020 augmentin.  Also started on singulair.  Patient reported she hasn't had any flonase or singular lately ran out.  Needs new Rx.  Patient did not develop sx of cough, trouble breathing, chest pain, nausea, vomiting, diarrhea, HA, fatigue, body aches, loss of taste/smell or chills.    Reviewed possible Covid sx including cough, shortness of breath with exertion or at rest, runny nose, congestion, sinus pain/pressure, sore throat, fever/chills, body aches, fatigue, loss of taste/smell, GI symptoms of nausea/vomiting/diarrhea.   48 hour trial allergy meds and treatment for otitis will call her tomorrow afternoon and if no improvement symptoms/still fever will send for covid testing day 3 and continue 5 day quarantine.  So quarantine 1/11-1/12/20222.  Electronic Rx sent to her pharmacy of choice claritin 10mg  po daily #90 RF3, fluticasone nasal 1 spray each nostril BID #1 RF6, singulair 10mg  po qhs #90 RF3, nasal saline 2 sprays each nostril q2h wa prn congestion/rhinitis.  Augmentin 875mg  po BID x 10 days #20 RF0.  Consider patient to isolate in own room and if possible use only one bathroom if living with others in home.  Wear mask when out of room to help prevent spread to others in household.  Sanitize/disinfect high touch surfaces counters/door/cabinet/faucet/microwave/fridge handles with lysol/clorox/bleach wipes daily.   Pt verbalized understanding and agreement with  plan of care. No further questions/concerns at this time. Pt reminded to contact clinic with any changes in sx or questions/concerns. Patient A&Ox3 respirations even and unlabored no cough during 15 minute telephone call.  Some nasal congestion noted.  Spoke full sentences without difficulty.

## 2021-01-03 ENCOUNTER — Other Ambulatory Visit: Payer: Self-pay

## 2021-01-03 ENCOUNTER — Other Ambulatory Visit: Payer: No Typology Code available for payment source

## 2021-01-03 DIAGNOSIS — Z20822 Contact with and (suspected) exposure to covid-19: Secondary | ICD-10-CM

## 2021-01-03 MED ORDER — BENZONATATE 200 MG PO CAPS: 200.0000 mg | ORAL_CAPSULE | Freq: Three times a day (TID) | ORAL | 0 refills | Status: AC | PRN

## 2021-01-03 MED ORDER — PROMETHAZINE HCL 6.25 MG/5ML PO SYRP: 6.2500 mg | ORAL_SOLUTION | Freq: Four times a day (QID) | ORAL | 0 refills | Status: DC | PRN

## 2021-01-03 NOTE — Telephone Encounter (Signed)
Spoke with pt by phone. She reports sx have worsened. Now with sore throat, runny nose and cough. Taking Dayquil/Nyquil. Picked up all Rx meds yesterday afternoon and started them. Test scheduled at [facility] in [place] tonight at 1700. Will plan f/u over the weekend to determine eligibility for RTW Monday.   Day 0 12/31/20 Day 5 01/05/21 RTW 01/07/21 with strict mask use thru 01/10/21  Pt denies further needs or concerns.

## 2021-01-03 NOTE — Telephone Encounter (Signed)
Patient contacted via telephone left message asking to be called earlier today for worsening symptoms and that she went to [facility] in [place] for covid testing at 1700 today.  Patient stated was asleep when I called yesterday  Patient stated cough worsening not to point of throwing up or feeling short of breath/wheezing but throat raw.  Has tried honey, dayquil/nyquil whole bottle, taking singulair, OTC antihistamine, flonase, augmentin, nasal saline and nothing is helping.  Denied white spots in back of throat or difficulty swallowing just painful.  Discussed with patient can add non drowsy Rx cough medicine for daytime tessalon pearles 200mg  po TID prn cough #30 RF0 and promethazine 6.25mg /87ml for nighttime to help her sleep.  Discussed if promethazine wearing off after 2-3 hours can redose another teaspoon then next night take 51ml/2 teaspoons instead of one.  Discussed no alcohol or driving after taking promethazine.  Discussed it is not a narcotic but it makes most patients very drowsy.  Will call to follow up with patient again tomorrow.  Discussed not to return to work until 9m known and symptoms have to be improving.  Patient reported testing site stated 72 hours for results.  Patient A&Ox3 spoke full sentences without difficulty; voice raspy no cough audible during 10 minute telephone call respirations even and unlabored.  Some nasal sniffing and throat clearing noted.  Patient verbalized understanding information/instructions, agreed with plan of care and had no further questions at this time.  HR team [employer] notified pending covid test results symptoms worse not cleared to return onsite.

## 2021-01-04 ENCOUNTER — Other Ambulatory Visit: Payer: No Typology Code available for payment source

## 2021-01-05 ENCOUNTER — Other Ambulatory Visit: Payer: No Typology Code available for payment source

## 2021-01-05 LAB — NOVEL CORONAVIRUS, NAA: SARS-CoV-2, NAA: DETECTED — AB

## 2021-01-05 LAB — SARS-COV-2, NAA 2 DAY TAT

## 2021-01-06 NOTE — Telephone Encounter (Signed)
Patient returned call still coughing and having headache when she takes promethazine.  Honey and dayquil/nyquil/tessalon pearles helping some.  Just picked up Rx yesterday.  Denied wheezing/coughing until throwing up or chest pain/tightness.  Asked if I would see her test results as she does not understand them.  Reviewed Epic covid test positive.  Discussed I recommended her household members get tested.  She stated no one else is having symptoms but her.  I discussed each person can have none to some to severe symptoms with covid and each person different.  With headache could be related to hypoxia, coughing, dehydration or viral infection itself.  Per up to date headache not common side effect with promethazine but it can elevate blood pressure.  Patient reported drinking water and peeing more often then usual doesn't think dehydrated.  Discussed can take tylenol 1000mg  po q6h prn or motrin/ibuprofin/advil 800mg  po q8h prn headache.  Eat regular meals/rest.  Cough drops make her cough more.  A&Ox3 respirations even and unlabored no throat clearing/nasal congestion or cough during 10 minute telephone call.  Reconfirmed no using employee lunch room or removing mask at work through 10 Jan 2021.  If patient unable to work entire shift and needs work restrictions she will need to follow up with her PCM for work restriction note as I am unable to write work restrictions due to contract limitations.  Patient verbalized understanding information/instructions, agreed with plan of care and had no further questions at this time.  HR notified of positive test results and unsure if returning tomorrow.  Patient adjusting her cough medicine to see if can decrease coughing episodes today.  If not will stay home tomorrow.

## 2021-01-07 NOTE — Telephone Encounter (Signed)
Spoke with pt by phone. She reports cough is unchanged with promethazine syrup. Does not feel well enough right now to RTW tomorrow 1/18. No cough or throat clearing noted during 5 minute phone call.   HA resolved with increasing fluids.   Advised pt to follow usual call out procedures regarding additional days out of work so supervisors are aware and that clinic staff will f/u with her daily to help determine ability to RTW.

## 2021-01-08 NOTE — Telephone Encounter (Signed)
Reviewed RN Rolly Salter note cough improving and plans to return to work 01/09/2021

## 2021-01-08 NOTE — Telephone Encounter (Signed)
Noted headache resolved but promethazine not helping cough. Will follow up with patient 01/08/2021

## 2021-01-08 NOTE — Telephone Encounter (Signed)
Spoke with pt by phone. She reports feeling much better today. Still coughing, but less frequent. Rarely productive. Feels okay coming to work tomorrow. Advised pt clinic staff okay with this and will reach out tomorrow to confirm return and no worsening of sx. Pt has clinic contact info if needed prior to return.

## 2021-01-10 NOTE — Telephone Encounter (Signed)
Noted returned to work and no further concerns at this time.

## 2021-01-10 NOTE — Telephone Encounter (Signed)
Confirmed with HR pt did RTW 1/19 as expected. Closing encounter. 

## 2021-01-13 NOTE — Telephone Encounter (Signed)
Patient contacted via telephone reported cough resolved feeling well and no concerns with work

## 2021-03-05 ENCOUNTER — Encounter: Payer: Self-pay | Admitting: Registered Nurse

## 2021-03-05 ENCOUNTER — Other Ambulatory Visit: Payer: Self-pay

## 2021-03-05 ENCOUNTER — Ambulatory Visit: Payer: Self-pay | Admitting: Registered Nurse

## 2021-03-05 VITALS — BP 119/90 | HR 83 | Temp 99.6°F

## 2021-03-05 DIAGNOSIS — S20212A Contusion of left front wall of thorax, initial encounter: Secondary | ICD-10-CM

## 2021-03-05 DIAGNOSIS — S86911D Strain of unspecified muscle(s) and tendon(s) at lower leg level, right leg, subsequent encounter: Secondary | ICD-10-CM

## 2021-03-05 DIAGNOSIS — M7651 Patellar tendinitis, right knee: Secondary | ICD-10-CM

## 2021-03-05 DIAGNOSIS — M7652 Patellar tendinitis, left knee: Secondary | ICD-10-CM

## 2021-03-05 MED ORDER — BIOFREEZE 4 % EX GEL: 1.0000 "application " | Freq: Four times a day (QID) | CUTANEOUS | 0 refills | Status: AC | PRN

## 2021-03-05 MED ORDER — ACETAMINOPHEN 500 MG PO TABS: 1000.0000 mg | ORAL_TABLET | Freq: Four times a day (QID) | ORAL | 0 refills | Status: AC | PRN

## 2021-03-05 MED ORDER — SALINE SPRAY 0.65 % NA SOLN: 2.0000 | NASAL | 0 refills | Status: DC

## 2021-03-05 NOTE — Patient Instructions (Signed)
Splenic Injury  A splenic injury is an injury of the spleen. The spleen is an organ located in the upper left area of the abdomen, just under the ribs. The spleen filters and cleans the blood. It also stores blood cells and destroys cells that are worn out. The spleen also plays an important role in fighting disease. Splenic injuries can vary. In some cases, the spleen may only be bruised, with some bleeding inside the covering and around the spleen. Splenic injuries may also cause a deep tear or cut into the spleen (lacerated spleen). Some splenic injuries can cause the spleen to break open (rupture). What are the causes? This condition may be caused by a direct blow (trauma) to the spleen. Trauma can result from:  Car accidents.  Contact sports.  Falls.  Penetrating injuries. These can be caused by gunshot wounds or sharp objects such as a knife. What increases the risk? You may be at greater risk for a splenic injury if you have a disease that can cause the spleen to become enlarged. These include:  Alcoholic liver disease.  Viral infections, especially mononucleosis.  Certain inflammatory diseases, such as lupus.  Certain cancers, especially those that involve the lymphatic system.  Cystic fibrosis. What are the signs or symptoms? Symptoms of this condition depend on the severity of the injury. A minor injury often causes no symptoms or only minor pain in the abdomen. A major injury can result in severe bleeding, causing your blood pressure to decrease rapidly. This in turn will cause symptoms such as:  Dizziness or light-headedness.  Rapid heart rate.  Difficulty breathing.  Fainting.  Sweating with clammy skin. Other symptoms of a splenic injury may include:  Very bad abdominal pain.  Pain in the left shoulder.  Pain when the abdomen is pressed (tenderness).  Nausea.  Swelling or bruising of the abdomen. How is this diagnosed? This condition may be diagnosed  based on:  Your symptoms and medical history, especially if you were recently in an accident or you recently got hurt.  A physical exam.  Imaging tests, such as: ? CT scan. ? Ultrasound. You may have frequent blood tests for a few days after the injury to monitor your condition. How is this treated? Treatment depends on the type of splenic injury you have and how bad it is.  Less severe injuries may be treated with: ? Observation. ? Interventional radiology. This involves using flexible tubes (catheters) to stop the bleeding from inside the blood vessel.  More severe injuries may require hospitalization in the intensive care unit (ICU). While you are in the hospital: ? Your fluid and blood levels will be monitored closely. ? You will get fluids through an IV as needed. ? You may need follow-up scans to check whether your spleen is able to heal itself. If the injury is getting worse, you may need surgery. ? You may receive donated blood (transfusion). ? You may have a long needle inserted into your abdomen to remove any blood that has collected inside the spleen (hematoma).  If your blood pressure is too low, you may need emergency surgery. This may include: ? Repairing a laceration. ? Removing part of the spleen. ? Removing the entire spleen (splenectomy). Follow these instructions at home: Activity  Rest as told by your health care provider.  Avoid sitting for a long time without moving. Get up to take short walks every 1-2 hours. This is important to improve blood flow and breathing. Ask for help   if you feel weak or unsteady.  Do not participate in any activity that takes a lot of effort until your health care provider says that it is safe.  Do not take part in contact sports until your health care provider says it is safe to do so.  Do not lift anything that is heavier than 10 lb (4.5 kg), or the limit that you are told, until your health care provider says that it is  safe. General instructions  Take over-the-counter and prescription medicines only as told by your health care provider.  Stay up-to-date on vaccines as told by your health care provider.  Follow instructions from your health care provider about eating or drinking restrictions.  Do not drink alcohol.  Do not use any products that contain nicotine or tobacco, such as cigarettes and e-cigarettes. These may delay healing after an injury. If you need help quitting, ask your health care provider.  Keep all follow-up visits as told by your health care provider. This is important. Get help right away if you have:  A fever.  New or increasing pain in your abdomen or in your left shoulder.  Signs or symptoms of internal bleeding. Watch for: ? Sweating. ? Dizziness. ? Weakness. ? Cold and clammy skin. ? Fainting.  Chest pain or difficulty breathing. Summary  The spleen is an organ located in the upper left area of the abdomen, just under the ribs.  A splenic injury is an injury of the spleen. This may be caused by a direct blow (trauma) to the spleen.  You may be at greater risk for a splenic injury if you have a disease that can cause the spleen to become enlarged.  A minor injury often causes no symptoms or only minor pain in the abdomen. A major injury can result in severe bleeding, causing your blood pressure to decrease rapidly.  Treatment depends on the type of splenic injury you have and how bad it is. This information is not intended to replace advice given to you by your health care provider. Make sure you discuss any questions you have with your health care provider. Document Revised: 10/26/2020 Document Reviewed: 10/26/2020 Elsevier Patient Education  2021 [employer] Contusion A contusion is a deep bruise. Contusions are the result of a blunt injury to tissues and muscle fibers under the skin. The injury causes bleeding under the skin. The skin overlying the contusion  may turn blue, purple, or yellow. Minor injuries will give you a painless contusion, but more severe injuries cause contusions that may stay painful and swollen for a few weeks. Follow these instructions at home: Pay attention to any changes in your symptoms. Let your health care provider know about them. Take these actions to relieve your pain. Managing pain, stiffness, and swelling  Use resting, icing, applying pressure (compression), and raising (elevating) the injured area. This is often called the RICE strategy. ? Rest the injured area. Return to your normal activities as told by your health care provider. Ask your health care provider what activities are safe for you. ? If directed, put ice on the injured area:  Put ice in a plastic bag.  Place a towel between your skin and the bag.  Leave the ice on for 20 minutes, 2-3 times per day. ? If directed, apply light compression to the injured area using an elastic bandage. Make sure the bandage is not wrapped too tightly. Remove and reapply the bandage as directed by your health care provider. ?  If possible, raise (elevate) the injured area above the level of your heart while you are sitting or lying down.   General instructions  Take over-the-counter and prescription medicines only as told by your health care provider.  Keep all follow-up visits as told by your health care provider. This is important. Contact a health care provider if:  Your symptoms do not improve after several days of treatment.  Your symptoms get worse.  You have difficulty moving the injured area. Get help right away if:  You have severe pain.  You have numbness in a hand or foot.  Your hand or foot turns pale or cold. Summary  A contusion is a deep bruise.  Contusions are the result of a blunt injury to tissues and muscle fibers under the skin.  It is treated with rest, ice, compression, and elevation. You may be given over-the-counter medicines for  pain.  Contact a health care provider if your symptoms do not improve, or get worse.  Get help right away if you have severe pain, have numbness, or the area turns pale or cold. This information is not intended to replace advice given to you by your health care provider. Make sure you discuss any questions you have with your health care provider. Document Revised: 07/29/2018 Document Reviewed: 07/29/2018 Elsevier Patient Education  2021 [employer] Knee Sprain, Adult  A knee sprain is a stretch or tear in a knee ligament. Knee ligaments are tissues that connect bones in the knee to each other. What are the causes? This condition often results from:  A fall.  An injury to the knee. What are the signs or symptoms? Symptoms of this condition include:  Trouble straightening or bending the leg.  Swelling in the knee.  Bruising around the knee.  Tenderness or pain in the knee.  Muscle spasms around the knee. How is this diagnosed? This condition may be diagnosed based on:  A physical exam.  A history of what happened just before you started to have symptoms.  Tests, including: ? An X-ray. This may be done to make sure no bones are broken. ? An MRI. This may be done to check if the ligament is torn. ? Stress testing of the knee. This may be done to check ligament damage. How is this treated? Treatment for this condition may involve:  Keeping the knee still (immobilized) with a cast, brace, or splint.  Applying ice to the knee. This helps with pain and swelling.  Raising (elevating) the knee above the level of your heart when you are resting. This helps with pain and swelling.  Taking medicine for pain.  Doing exercises to prevent or limit permanent weakness or stiffness in your knee.  Having surgery to reconnect the ligament to the bone or to reconstruct it. This may be needed if the ligament is completely torn. Follow these instructions at home: If you have a  splint or brace:  Wear it as told by your health care provider. Remove it only as told by your health care provider.  Check the skin around it every day. Tell your health care provider about any concerns.  Loosen it if your toes tingle, become numb, or turn cold and blue.  Keep it clean and dry. If you have a cast:  Do not stick anything inside it to scratch your skin. Doing that increases your risk of infection.  Check the skin around it every day. Tell your health care provider about any concerns.  You  may put lotion on dry skin around the edges of the cast. Do not put lotion on the skin underneath the cast.  Keep it clean and dry. Bathing If you have a splint, brace, or cast that is not waterproof:  Do not let it get wet.  Cover it with a watertight covering when you take a bath or a shower. Managing pain, stiffness, and swelling  If directed, put ice on the injured area. To do this: ? If you have a removable splint or brace, remove it as told by your health care provider. ? Put ice in a plastic bag. ? Place a towel between your skin and the bag or between your cast and the bag. ? Leave the ice on for 20 minutes, 2-3 times a day.  Move your toes often to reduce stiffness and swelling.  Elevate the injured area above the level of your heart while you are sitting or lying down.   General instructions  Take over-the-counter and prescription medicines only as told by your health care provider.  Do not use any products that contain nicotine or tobacco, such as cigarettes, e-cigarettes, and chewing tobacco. These can delay healing. If you need help quitting, ask your health care provider.  Do exercises as told by your health care provider.  Keep all follow-up visits as told by your health care provider. This is important. Contact a health care provider if:  You have pain that gets worse.  The cast, brace, or splint does not fit right.  The cast, brace, or splint gets  damaged. Get help right away if:  You cannot use your injured knee to support any of your body weight (cannot bear weight).  You cannot move the injured joint.  You cannot walk more than a few steps without pain or without your knee buckling.  You have significant pain, swelling, or numbness in the leg below the cast, brace, or splint.  Your foot or toes are numb, cold, or blue after loosening your splint or brace. Summary  A knee sprain is a stretch or tear in a knee ligament that usually occurs as the result of a fall or injury.  Treatment may involve immobilizing the knee with a cast, splint, or brace and then doing exercises.  If the ligament is completely torn, it may require surgery to repair or replace the injured ligament. This information is not intended to replace advice given to you by your health care provider. Make sure you discuss any questions you have with your health care provider. Document Revised: 10/28/2019 Document Reviewed: 10/28/2019 Elsevier Patient Education  2021 [employer].

## 2021-03-05 NOTE — Progress Notes (Signed)
Subjective:    Patient ID: Shari Shields, female    DOB: 1976-06-11, 45 y.o.   MRN: [id number]  44y/o african [place] female established patient fell at home on curb carrying groceries.  Left ribs sore there were jars in bag she was carrying.  She also wrenched right knee and fell on hip also.  Sometimes pain radiates down right leg into toes of foot.  Did not see rash/cuts/bruising.  Saw provider and MRI ordered for knee.  Scheduled for next week.  Here to follow up today since motrin 800mg  BID po not relieving all pain and still having swelling in hip/knee right and left ribs.  Wondering if she needs to restart different antiinflammatory she was previously on only available by Rx.  Denied hitting head/loss of consciousness.     Review of Systems  Constitutional: Positive for activity change. Negative for appetite change, chills, diaphoresis, fatigue and fever.  HENT: Negative for congestion, dental problem, drooling, ear discharge, ear pain, trouble swallowing and voice change.   Eyes: Negative for photophobia and visual disturbance.  Respiratory: Negative for cough, shortness of breath, wheezing and stridor.   Cardiovascular: Positive for chest pain and leg swelling. Negative for palpitations.  Gastrointestinal: Positive for abdominal pain. Negative for abdominal distention, constipation, diarrhea, nausea and vomiting.  Endocrine: Negative for cold intolerance and heat intolerance.  Genitourinary: Negative for difficulty urinating.  Musculoskeletal: Positive for arthralgias, joint swelling and myalgias. Negative for back pain, gait problem, neck pain and neck stiffness.  Skin: Negative for color change, pallor, rash and wound.  Allergic/Immunologic: Positive for environmental allergies. Negative for food allergies.  Neurological: Negative for dizziness, tremors, seizures, syncope, facial asymmetry, speech difficulty, weakness, light-headedness, numbness and headaches.  Hematological:  Negative for adenopathy. Does not bruise/bleed easily.  Psychiatric/Behavioral: Negative for agitation, confusion and sleep disturbance.       Objective:   Physical Exam Vitals and nursing note reviewed.  Constitutional:      General: She is awake. She is not in acute distress.    Appearance: Normal appearance. She is well-developed and well-groomed. She is obese. She is not ill-appearing, toxic-appearing or diaphoretic.  HENT:     Head: Normocephalic and atraumatic.     Jaw: There is normal jaw occlusion.     Salivary Glands: Right salivary gland is not diffusely enlarged or tender. Left salivary gland is not diffusely enlarged or tender.     Right Ear: Hearing and external ear normal.     Left Ear: Hearing and external ear normal.     Nose: Nose normal. No congestion or rhinorrhea.     Mouth/Throat:     Pharynx: Oropharynx is clear.  Eyes:     General: Lids are normal. Vision grossly intact. Gaze aligned appropriately. No allergic shiner, visual field deficit or scleral icterus.       Right eye: No discharge.        Left eye: No discharge.     Extraocular Movements: Extraocular movements intact.     Conjunctiva/sclera: Conjunctivae normal.     Pupils: Pupils are equal, round, and reactive to light.  Neck:     Trachea: Trachea and phonation normal.  Cardiovascular:     Rate and Rhythm: Normal rate and regular rhythm.     Pulses: Normal pulses.          Radial pulses are 2+ on the right side and 2+ on the left side.     Heart sounds: Normal heart sounds. No murmur heard.  No friction rub. No gallop.   Pulmonary:     Effort: Pulmonary effort is normal. No respiratory distress.     Breath sounds: Normal breath sounds and air entry. No stridor or transmitted upper airway sounds. No wheezing, rhonchi or rales.     Comments: Wearing mask due to covid 19 pandemic; spoke full sentences without difficulty; no cough/throat clearing or nasal congestion noted in exam room Chest:     Chest  wall: Tenderness present. No mass, lacerations, deformity, swelling, crepitus or edema. There is no dullness to percussion.    Abdominal:     General: Bowel sounds are decreased. There is no distension.     Palpations: Abdomen is soft. There is no shifting dullness, fluid wave, hepatomegaly, splenomegaly, mass or pulsatile mass.     Tenderness: There is no abdominal tenderness. There is no right CVA tenderness, left CVA tenderness, guarding or rebound. Negative signs include Murphy's sign.     Hernia: No hernia is present. There is no hernia in the umbilical area or ventral area.     Comments: Dull to percussion x 4quads; hypoactive bowel sounds x 4 quads  Musculoskeletal:        General: Swelling, tenderness and signs of injury present.     Cervical back: Normal range of motion and neck supple. No edema, erythema, signs of trauma, rigidity, torticollis, tenderness or crepitus. No pain with movement or muscular tenderness. Normal range of motion.     Right knee: Swelling, effusion and crepitus present. No deformity, erythema, ecchymosis, lacerations or bony tenderness. Decreased range of motion. Tenderness present over the patellar tendon. Normal alignment, normal meniscus and normal patellar mobility.     Instability Tests: Anterior drawer test negative. Posterior drawer test negative.     Left knee: Crepitus present. No swelling, deformity, effusion, erythema, ecchymosis, lacerations or bony tenderness. Normal range of motion. No tenderness. Normal alignment, normal meniscus and normal patellar mobility.     Instability Tests: Anterior drawer test negative. Posterior drawer test negative.     Right lower leg: No swelling, deformity, lacerations, tenderness or bony tenderness. No edema.     Left lower leg: No swelling, deformity, lacerations, tenderness or bony tenderness. No edema.     Right ankle: No swelling, deformity, ecchymosis or lacerations. No tenderness. Normal range of motion.     Left  ankle: No swelling, deformity, ecchymosis or lacerations. No tenderness. Normal range of motion.       Legs:     Comments: Areas of nonpitting edema also increased temperature noted; left greater than right audible crepitus bilateral knees; pain with performance of anterior/posterior drawer right leg but test otherwise negative no laxity  Lymphadenopathy:     Head:     Right side of head: No submental, submandibular, tonsillar or preauricular adenopathy.     Left side of head: No submental, submandibular, tonsillar or preauricular adenopathy.     Cervical: No cervical adenopathy.     Right cervical: No superficial cervical adenopathy.    Left cervical: No superficial cervical adenopathy.  Skin:    General: Skin is warm and dry.     Capillary Refill: Capillary refill takes less than 2 seconds.     Coloration: Skin is not ashen, cyanotic, jaundiced, mottled, pale or sallow.     Findings: No abrasion, abscess, acne, bruising, burn, ecchymosis, erythema, signs of injury, laceration, lesion, petechiae, rash or wound.     Nails: There is no clubbing.  Neurological:     General: No  focal deficit present.     Mental Status: She is alert and oriented to person, place, and time. Mental status is at baseline.     GCS: GCS eye subscore is 4. GCS verbal subscore is 5. GCS motor subscore is 6.     Cranial Nerves: Cranial nerves are intact. No cranial nerve deficit, dysarthria or facial asymmetry.     Sensory: Sensation is intact. No sensory deficit.     Motor: Motor function is intact. No weakness, tremor, atrophy, abnormal muscle tone or seizure activity.     Coordination: Coordination is intact. Coordination normal.     Gait: Gait is intact. Gait normal.     Comments: In/out of chair and on/off exam table without difficulty; bilateral hand grasp equal 5/5  Psychiatric:        Attention and Perception: Attention and perception normal.        Mood and Affect: Mood and affect normal.        Speech:  Speech normal.        Behavior: Behavior normal. Behavior is cooperative.        Thought Content: Thought content normal.        Cognition and Memory: Cognition and memory normal.        Judgment: Judgment normal.   fitted and distributed 2XL right knee brace neoprene pull on from clinic stock to patient.  Discussed care and use with patient e.g. handwash only may use blowdryer to help dry but no washer or dryer use.  Patient verbalized understanding information/instructions, agreed with plan of care and had no further questions at this time.        Assessment & Plan:  A-right knee strain subsequent visit, contusion chest wall, bilateral patellar tendonitis  P-Nonpitting edema lateral right hip/thigh.  Effusion right knee with crepitus and decreased AROM compared to left.  Left knee crepitus without effusion.  No limp noted with gait in hallway/clinic.  Encouraged cryotherapy 15 minutes QID prn pain/swelling has reusable ice pack at home.  Elevate leg when sitting.  Wear knee sleeve when at work/walking.  Apply Biofreeze gel topical QID prn given 4 UD from clinic stock, tylenol 1000mg  po q6h prn pain then motrin 800mg  po every 8 hours take with food if no relief with elevation/rest/ice/brace/tylenol/biofreeze.  Notify me in 48 hours if no relief with this regimen then would switch back to mobic 15mg  po daily has used in the past with good results.  Has MRI scheduled pending next week from [facility].  Fall at home.  If unable to perform work duties to notify supervisor/follow up with [facility] for work limitations.  Discussed with patient trial epsom salt bath also.  Discussed contusion soft tissue possible bone also extremity/chest intercostal muscles/rib/spleen.  Bone bruise may take months to resolve pain gradually improving.  Soft tissue typically takes weeks to heal.   Consider compression leggings or thigh high sock wear right lower extremity helps to keep swelling down.   Has reusable ice pack at home.   Epsom salt soaks daily may be helpful for swelling and pain.  Cryotherapy 15 minutes QID prn pain/swelling. Medications as directed. Call or return to clinic as needed if these symptoms worsen or fail to improve as anticipated especially if extremity worsening swelling/cold/blue  I recommend weight loss when recovered from this injury.  Patient verbalized understanding information/instructions, agreed with plan of care and had no further questions at this time.

## 2021-03-07 ENCOUNTER — Encounter: Payer: Self-pay | Admitting: Registered Nurse

## 2021-03-07 ENCOUNTER — Telehealth: Payer: Self-pay | Admitting: Registered Nurse

## 2021-03-07 DIAGNOSIS — S83241D Other tear of medial meniscus, current injury, right knee, subsequent encounter: Secondary | ICD-10-CM

## 2021-03-07 NOTE — Telephone Encounter (Signed)
Patient reported feeling less pain wearing knee brace and does not want to change nsaid at this time.  Left rib pain also improved today.  Patient has appt next week for knee MRI ordered by PCM.

## 2021-03-14 MED ORDER — MELOXICAM 15 MG PO TABS: 15.0000 mg | ORAL_TABLET | Freq: Every day | ORAL | 0 refills | Status: DC

## 2021-03-14 NOTE — Telephone Encounter (Signed)
Patient seen in clinic today wanted to discuss MRI results as ordering provider out of office until next week and not knowing results causing her anxiety/stress.  RN Rolly Salter will email network orthopedic provider list to her so she has when following up with [facility] for her results.  Discussed with patient ice, continue knee brace, stopping motrin and switching to meloxicam 15mg  po daily as motrin not helping #30 RF0 electronic Rx sent to her pharmacy of choice.  Take with food and avoid all other NSAIDS while on meloxicam.  Patient reported right hip and knee still swollen able to perform job duties.  Knee sometimes catching and giving out.  Patient ortho provider preference [facility] in [place], [place] if they are still in network she used them 4 years ago and had PT with them.  Discussed with patient typical conservative therapy involves PT, injection, low impact activity weight loss and if no improvement in symptoms then surgery/meniscectomy and possibly popliteal cyst removal also.  Patient verbalized understanding information/instructions, agreed with plan of care and had no further questions at this time.  [place], MD - 03/13/2021  Formatting of this note might be different from the original.  EXAM: MRI LOWER EXTREMITY JOINT RIGHT WO CONTRAST  DATE: 03/13/2021 2:03 PM  ACCESSION: 03/15/2021 UN  DICTATED: 03/13/2021 2:14 PM  INTERPRETATION LOCATION: [facility]   CLINICAL INDICATION: 45 years old Female with right knee pain  - M25.561 - Right knee pain, unspecified chronicity     COMPARISON: None.   TECHNIQUE: MRI of the right knee was performed without contrast using a local coil.  Multisequence, multiplanar images were obtained.   FINDINGS:   Bone marrow (excluding subchondral bone): No fracture. No aggressive osseous lesion.   Medial compartment: Complete radial tear of the posterior root of the medial meniscus with associated degeneration and extrusion of the meniscal body into  the medial recess. Mild to moderate cartilage thinning and irregularity along the weightbearing medial femoral condyle. Subchondral bone is normal.   Lateral compartment: The lateral meniscus, articular cartilage, and subchondral bone are normal.   Patellofemoral compartment: Multifocal cartilage heterogeneity and surface irregularity with near full-thickness fissure along the medial patellar facet (3:12).   The ligaments of the knee are intact.  The extensor mechanism is normal.  The medial and lateral patellar retinacula are normal. The popliteus tendon is normal.   No knee effusion. Popliteal cyst measuring approximately 4.0 cm AP x 1.4 cm transverse x 5.6 cm craniocaudal (3:19, 5:19).  There are no loose intra-articular bodies.   Fat pads are normal. Mild anterior knee subcutaneous edema, nonspecific.    IMPRESSION:   1. Complete radial tear of the posterior root of the medial meniscus with associated extrusion and degeneration of the medial meniscal body.   2. 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   3. Popliteal cyst.   4. Intact ligaments. Exam End: 03/13/21  2:03 PM   Specimen Collected: 03/13/21  2:14 PM Last Resulted: 03/13/21  3:06 PM  Received From: [facility]  Result Received: 03/14/21 10:10 AM

## 2021-04-18 NOTE — Telephone Encounter (Signed)
Patient seen at [facility] and left knee MRI now ordered also, continue meloxicam and follow up visit scheduled to discuss both MRI results after left knee completed.

## 2021-04-27 NOTE — Telephone Encounter (Signed)
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  Intact menisci and ligaments.  Tiny ruptured popliteal cyst.        MRI Lower Extremity Joint Left Wo Contrast (04/24/2021 10:10 AM EDT)  Narrative  [facility] - 04/24/2021 10:31 AM EDT   EXAM: MRI LOWER EXTREMITY JOINT LEFT WO CONTRAST DATE: 04/24/2021 ACCESSION: [id number] UN DICTATED: 04/24/2021 10:26 AM INTERPRETATION LOCATION: [facility]  CLINICAL INDICATION: 45 years old Female with Patellofemoral disorder of left knee  - M22.2X2 - Patellofemoral disorder of left knee    COMPARISON: None.  TECHNIQUE: MRI of the Left knee was performed without contrast using a local coil.  Multisequence, multiplanar images were obtained.  FINDINGS:  Bone marrow (excluding subchondral bone): Within normal limits  Medial compartment: The medial meniscus, articular cartilage, and subchondral bone are normal.  Lateral compartment: Intact lateral meniscus. Few small regions of cartilage thinning and deep fissuring of the tibial plateau. No full-thickness cartilage defect.  Patellofemoral compartment: Diffuse cartilage thinning, greatest at the lateral patellar facet with region of full-thickness cartilage thinning and subchondral cystic change. Moderate thinning of the adjacent femoral trochlear cartilage.  The ligaments of the knee are intact.  The extensor mechanism is normal.  The medial and lateral patellar retinacula are normal. The popliteus tendon is normal.  No effusion. Tiny ruptured popliteal cyst.  There are no loose intra-articular bodies.  Mild diffuse subcutaneous edema.       MRI Lower Extremity Joint Left Wo Contrast (04/24/2021 10:10 AM EDT)  Procedure Note  Lindie Spruce, MD - 04/24/2021   Formatting of this note might be different from the original. EXAM: MRI LOWER EXTREMITY JOINT LEFT WO  CONTRAST DATE: 04/24/2021 ACCESSION: [id number] UN DICTATED: 04/24/2021 10:26 AM INTERPRETATION LOCATION: [facility]  CLINICAL INDICATION: 45 years old Female with Patellofemoral disorder of left knee - M22.2X2 - Patellofemoral disorder of left knee   COMPARISON: None.  TECHNIQUE: MRI of the Left knee was performed without contrast using a local coil. Multisequence, multiplanar images were obtained.  FINDINGS:  Bone marrow (excluding subchondral bone): Within normal limits  Medial compartment: The medial meniscus, articular cartilage, and subchondral bone are normal.  Lateral compartment: Intact lateral meniscus. Few small regions of cartilage thinning and deep fissuring of the tibial plateau. No full-thickness cartilage defect.  Patellofemoral compartment: Diffuse cartilage thinning, greatest at the lateral patellar facet with region of full-thickness cartilage thinning and subchondral cystic change. Moderate thinning of the adjacent femoral trochlear cartilage.  The ligaments of the knee are intact. The extensor mechanism is normal. The medial and lateral patellar retinacula are normal. The popliteus tendon is normal.  No effusion. Tiny ruptured popliteal cyst. There are no loose intra-articular bodies.  Mild diffuse subcutaneous edema.   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  Intact menisci and ligaments.  Tiny ruptured popliteal cyst.

## 2021-04-30 NOTE — Telephone Encounter (Signed)
Discussed MRI results with patient as still waiting for [facility] provider office to contact her for follow up appt.  Patient stated knee sleeve helping a lot with right knee.  Tried to buy a sleeve for other knee but couldn't find one like we gave to her.  Discussed with patient to come to clinic and we could dispense for her left knee.  RN Rolly Salter notified.  Patient still taking meloxicam.  Pain about the same throughout the day.  Was off work for one week.  Discussed orthopedics will determine plan of care regarding further procedures/etc.  I did encourage weight loss, gentle AROM, cryotherapy prn swelling.  Patient verbalized understanding information/instructions, agreed with plan of care and had no further questions at this time.

## 2021-04-30 NOTE — Telephone Encounter (Signed)
Patient came to clinic and was fitted and distributed left knee sleeve by RN Rolly Salter.  Patient gait sure and steady improved from previous office visit and swelling right hip/leg improved.  Patient stated she was calling [facility] when she finished shift today.

## 2021-07-01 ENCOUNTER — Ambulatory Visit: Payer: Self-pay | Admitting: *Deleted

## 2021-07-01 ENCOUNTER — Other Ambulatory Visit: Payer: Self-pay

## 2021-07-01 VITALS — BP 126/82 | Ht 66.0 in | Wt 307.0 lb

## 2021-07-01 DIAGNOSIS — R7989 Other specified abnormal findings of blood chemistry: Secondary | ICD-10-CM

## 2021-07-01 DIAGNOSIS — Z0000 Encounter for general adult medical examination without abnormal findings: Secondary | ICD-10-CM

## 2021-07-01 DIAGNOSIS — R7303 Prediabetes: Secondary | ICD-10-CM

## 2021-07-01 NOTE — Progress Notes (Signed)
[employer] insurance premium discount evaluation: Labs Drawn. Replacements ROI form signed. Tobacco Free Attestation form signed.  Forms placed in paper chart.  

## 2021-07-02 ENCOUNTER — Encounter: Payer: Self-pay | Admitting: Registered Nurse

## 2021-07-02 LAB — CMP12+LP+TP+TSH+6AC+CBC/D/PLT
ALT: 14 IU/L (ref 0–32)
AST: 14 IU/L (ref 0–40)
Albumin/Globulin Ratio: 1.5 (ref 1.2–2.2)
Albumin: 4.5 g/dL (ref 3.8–4.8)
Alkaline Phosphatase: 58 IU/L (ref 44–121)
BUN/Creatinine Ratio: 12 (ref 9–23)
BUN: 9 mg/dL (ref 6–24)
Basophils Absolute: 0.1 10*3/uL (ref 0.0–0.2)
Basos: 1 %
Bilirubin Total: 0.2 mg/dL (ref 0.0–1.2)
Calcium: 9.3 mg/dL (ref 8.7–10.2)
Chloride: 103 mmol/L (ref 96–106)
Chol/HDL Ratio: 3.3 ratio (ref 0.0–4.4)
Cholesterol, Total: 210 mg/dL — ABNORMAL HIGH (ref 100–199)
Creatinine, Ser: 0.76 mg/dL (ref 0.57–1.00)
EOS (ABSOLUTE): 0 10*3/uL (ref 0.0–0.4)
Eos: 1 %
Estimated CHD Risk: <0.5 times avg. (ref 0.0–1.0)
Free Thyroxine Index: 2.4 (ref 1.2–4.9)
GGT: 17 IU/L (ref 0–60)
Globulin, Total: 3.1 g/dL (ref 1.5–4.5)
Glucose: 73 mg/dL (ref 65–99)
HDL: 64 mg/dL (ref >39)
Hematocrit: 41.9 % (ref 34.0–46.6)
Hemoglobin: 13.3 g/dL (ref 11.1–15.9)
Immature Grans (Abs): 0 10*3/uL (ref 0.0–0.1)
Immature Granulocytes: 0 %
Iron: 45 ug/dL (ref 27–159)
LDH: 216 IU/L (ref 119–226)
LDL Chol Calc (NIH): 135 mg/dL — ABNORMAL HIGH (ref 0–99)
Lymphocytes Absolute: 1.9 10*3/uL (ref 0.7–3.1)
Lymphs: 34 %
MCH: 28.6 pg (ref 26.6–33.0)
MCHC: 31.7 g/dL (ref 31.5–35.7)
MCV: 90 fL (ref 79–97)
Monocytes Absolute: 0.3 10*3/uL (ref 0.1–0.9)
Monocytes: 5 %
Neutrophils Absolute: 3.4 10*3/uL (ref 1.4–7.0)
Neutrophils: 59 %
Phosphorus: 3.3 mg/dL (ref 3.0–4.3)
Platelets: 355 10*3/uL (ref 150–450)
Potassium: 4.3 mmol/L (ref 3.5–5.2)
RBC: 4.65 x10E6/uL (ref 3.77–5.28)
RDW: 13.1 % (ref 11.7–15.4)
Sodium: 138 mmol/L (ref 134–144)
T3 Uptake Ratio: 32 % (ref 24–39)
T4, Total: 7.4 ug/dL (ref 4.5–12.0)
TSH: 1.26 u[IU]/mL (ref 0.450–4.500)
Total Protein: 7.6 g/dL (ref 6.0–8.5)
Triglycerides: 60 mg/dL (ref 0–149)
Uric Acid: 4 mg/dL (ref 2.6–6.2)
VLDL Cholesterol Cal: 11 mg/dL (ref 5–40)
WBC: 5.7 10*3/uL (ref 3.4–10.8)
eGFR: 99 mL/min/{1.73_m2} (ref >59)

## 2021-07-02 LAB — HGB A1C W/O EAG: Hgb A1c MFr Bld: 5.9 % — ABNORMAL HIGH (ref 4.8–5.6)

## 2021-07-02 NOTE — Addendum Note (Signed)
Addended by: Albina Billet A on: 07/02/2021 01:29 PM   Modules accepted: Orders

## 2021-07-03 ENCOUNTER — Encounter: Payer: Self-pay | Admitting: Registered Nurse

## 2021-07-03 MED ORDER — CHOLECALCIFEROL 1.25 MG (50000 UT) PO TABS: 1.0000 | ORAL_TABLET | ORAL | 0 refills | Status: AC

## 2021-07-03 NOTE — Addendum Note (Signed)
Addended by: Albina Billet A on: 07/03/2021 11:25 AM   Modules accepted: Orders

## 2021-07-08 LAB — VITAMIN D 25 HYDROXY (VIT D DEFICIENCY, FRACTURES): Vit D, 25-Hydroxy: 20.4 ng/mL — ABNORMAL LOW (ref 30.0–100.0)

## 2021-07-08 LAB — SPECIMEN STATUS REPORT

## 2021-07-08 NOTE — Progress Notes (Signed)
LVM for pt advising of Rx being sent as she has not viewed MyChart mesg. Advised pt to check MyChart for details or call clinic.

## 2021-07-08 NOTE — Progress Notes (Signed)
Noted RN Rolly Salter attempted to notify patient of Rx for Vitamin D as patient has not yet read my chart message

## 2021-07-12 NOTE — Progress Notes (Signed)
Appt made for 10/07/21 repeat nonfasting labs.

## 2021-07-12 NOTE — Progress Notes (Signed)
Noted labs scheduled 

## 2021-10-08 ENCOUNTER — Encounter: Payer: Self-pay | Admitting: Registered Nurse

## 2021-10-08 ENCOUNTER — Ambulatory Visit: Payer: Self-pay | Admitting: Registered Nurse

## 2021-10-08 ENCOUNTER — Other Ambulatory Visit: Payer: Self-pay

## 2021-10-08 VITALS — BP 142/90 | HR 85

## 2021-10-08 DIAGNOSIS — M62830 Muscle spasm of back: Secondary | ICD-10-CM

## 2021-10-08 DIAGNOSIS — R7303 Prediabetes: Secondary | ICD-10-CM

## 2021-10-08 DIAGNOSIS — S39012A Strain of muscle, fascia and tendon of lower back, initial encounter: Secondary | ICD-10-CM

## 2021-10-08 DIAGNOSIS — R7989 Other specified abnormal findings of blood chemistry: Secondary | ICD-10-CM

## 2021-10-08 MED ORDER — IBUPROFEN 800 MG PO TABS: 800.0000 mg | ORAL_TABLET | Freq: Three times a day (TID) | ORAL | 0 refills | Status: AC | PRN

## 2021-10-08 MED ORDER — CYCLOBENZAPRINE HCL 10 MG PO TABS: 5.0000 mg | ORAL_TABLET | Freq: Three times a day (TID) | ORAL | 0 refills | Status: AC | PRN

## 2021-10-08 NOTE — Progress Notes (Signed)
Subjective:    Patient ID: Shari Shields, female    DOB: 07-Oct-1976, 45 y.o.   MRN: [id number]  44y/o african [place] female established patient was sitting in folding chair and dry rotted canvas gave way and she fell through to the ground on 10/05/21.  Butt touched ground and knees stuck over frame. 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 Now with low left sided back pain. Denied shooting pain or numbness/tingling or loss of bowel or bladder control. Denied foot drop/difficulty walking/bruises/scratches.  Pain worsens as the work day progresses yesterday and today.  Tried tylenol and it is not helping.  Has run out of flexeril and motrin and would like refill.  Tried heat/stretching also.  Patient also here for follow up Hgba1c elevated prediabetes and Vitamin D level (previously 20s) check nonfasting as has been trying to eat healthier and take supplements for Vitamin D OTC.  Last labs fasting Jul 2022.     Review of Systems  Constitutional:  Negative for activity change, appetite change, chills, diaphoresis, fatigue and fever.  HENT:  Negative for trouble swallowing and voice change.   Eyes:  Negative for photophobia and visual disturbance.  Respiratory:  Negative for cough, shortness of breath, wheezing and stridor.   Cardiovascular:  Negative for chest pain.  Gastrointestinal:  Negative for diarrhea, nausea and vomiting.  Endocrine: Negative for cold intolerance and heat intolerance.  Genitourinary:  Negative for difficulty urinating and enuresis.  Musculoskeletal:  Positive for back pain and myalgias. Negative for gait problem, joint swelling, neck pain and neck stiffness.  Skin:  Negative for color change and wound.  Allergic/Immunologic: Positive for environmental allergies.  Neurological:  Negative for dizziness, tremors, seizures, syncope, facial asymmetry, speech difficulty, weakness, light-headedness, numbness and headaches.  Hematological:  Negative for  adenopathy. Does not bruise/bleed easily.  Psychiatric/Behavioral:  Negative for agitation, confusion and sleep disturbance.       Objective:   Physical Exam Vitals and nursing note reviewed.  Constitutional:      General: She is awake. She is not in acute distress.    Appearance: Normal appearance. She is well-developed. She is obese. She is not ill-appearing, toxic-appearing or diaphoretic.  HENT:     Head: Normocephalic and atraumatic.     Right Ear: Hearing and external ear normal.     Left Ear: Hearing and external ear normal.     Nose: Nose normal.     Mouth/Throat:     Lips: Pink. No lesions.     Mouth: Mucous membranes are moist. No angioedema.     Pharynx: Oropharynx is clear. Uvula midline. No oropharyngeal exudate.  Eyes:     General: Lids are normal. Vision grossly intact. Gaze aligned appropriately. No allergic shiner or scleral icterus.       Right eye: No discharge.        Left eye: No discharge.     Conjunctiva/sclera: Conjunctivae normal.     Pupils: Pupils are equal, round, and reactive to light.  Neck:     Thyroid: No thyromegaly.     Vascular: No JVD.     Trachea: Trachea and phonation normal. No tracheal deviation.  Cardiovascular:     Rate and Rhythm: Normal rate and regular rhythm.     Pulses:          Radial pulses are 2+ on the right side and 2+ on the left side.     Heart sounds: Normal heart sounds. No murmur heard. Pulmonary:  Effort: Pulmonary effort is normal. No respiratory distress.     Breath sounds: Normal breath sounds and air entry. No stridor or transmitted upper airway sounds. No wheezing.     Comments: Spoke full sentences without difficulty; no cough noted in exam room Abdominal:     Palpations: Abdomen is soft.     Tenderness: There is no abdominal tenderness.  Musculoskeletal:        General: Tenderness and signs of injury present. No swelling or deformity.     Right shoulder: Normal.     Left shoulder: Normal.     Right elbow:  Normal.     Left elbow: Normal.     Right hand: Normal.     Left hand: Normal.     Cervical back: Normal range of motion and neck supple. No swelling, edema, deformity, erythema, signs of trauma, lacerations, rigidity, spasms, torticollis, tenderness, bony tenderness or crepitus. No pain with movement or muscular tenderness. Normal range of motion.     Thoracic back: Normal. No swelling, edema, deformity, signs of trauma, lacerations, spasms or tenderness. Normal range of motion.     Lumbar back: Tenderness and bony tenderness present. No swelling, edema, deformity, lacerations or spasms. Decreased range of motion. Negative right straight leg raise test and negative left straight leg raise test.       Back:     Right hip: Normal.     Left hip: Tenderness present. No deformity, lacerations, bony tenderness or crepitus. Normal range of motion. Normal strength.     Right upper leg: Normal.     Left upper leg: Tenderness present. No swelling, edema, deformity, lacerations or bony tenderness.     Right knee: Normal.     Left knee: Normal.     Right lower leg: Normal.     Left lower leg: Normal.     Right ankle: Normal.     Left ankle: Normal.     Comments: Left lower back pain worsens with left straight leg raise; pain with moving from sitting to supine on exam table; SI joints not TTP; no palpable or visually noted edema thoracic/lumbar region; bilateral upper and lower extremity strength 5/5; normal heel toe gait; lumbar slightly decreased flexion unable to touch toes  Lymphadenopathy:     Head:     Right side of head: No preauricular adenopathy.     Left side of head: No preauricular adenopathy.     Cervical: No cervical adenopathy.     Right cervical: No superficial cervical adenopathy.    Left cervical: No superficial cervical adenopathy.  Skin:    General: Skin is warm and dry.     Capillary Refill: Capillary refill takes less than 2 seconds.     Coloration: Skin is not ashen,  cyanotic, jaundiced, mottled, pale or sallow.     Findings: No abrasion, abscess, acne, bruising, burn, ecchymosis, erythema, signs of injury, laceration, lesion, petechiae, rash or wound.     Nails: There is no clubbing.  Neurological:     General: No focal deficit present.     Mental Status: She is alert and oriented to person, place, and time. She is not disoriented.     GCS: GCS eye subscore is 4. GCS verbal subscore is 5. GCS motor subscore is 6.     Cranial Nerves: Cranial nerves are intact. No cranial nerve deficit, dysarthria or facial asymmetry.     Sensory: Sensation is intact. No sensory deficit.     Motor: Motor function is  intact. No weakness, tremor, atrophy, abnormal muscle tone or seizure activity.     Coordination: Coordination is intact. Coordination normal.     Gait: Gait is intact. Gait normal.     Deep Tendon Reflexes:     Reflex Scores:      Brachioradialis reflexes are 0 on the right side and 0 on the left side.      Patellar reflexes are 0 on the right side and 0 on the left side.    Comments: Strength 5/5 bilaterally arms/legs; unable to elicit reflexes even with distraction in exam room today upper or lower extremities  Psychiatric:        Attention and Perception: Attention and perception normal. She is attentive.        Mood and Affect: Mood and affect normal.        Speech: Speech normal.        Behavior: Behavior normal. Behavior is cooperative.        Thought Content: Thought content normal.        Cognition and Memory: Cognition and memory normal.        Judgment: Judgment normal.    Vit D, 25-Hydroxy 30.0 - 100.0 ng/mL 20.4 Low   22.9 Low  CM  22.3 Low  CM   Comment: Vitamin D deficiency has been defined by the Institute of  Medicine and an Endocrine Society practice guideline as a  level of serum 25-OH vitamin D less than 20 ng/mL (1,2).  The Endocrine Society went on to further define vitamin D  insufficiency as a level between 21 and 29 ng/mL (2).   1. IOM (Institute of Medicine). 2010. Dietary reference     intakes for calcium and D. [place]: The     [employer].  2. Holick MF, Binkley [place], Bischoff-Ferrari HA, et al.     Evaluation, treatment, and prevention of vitamin D     deficiency: an Endocrine Society clinical practice     guideline. JCEM. 2011 Jul; 96(7):1911-30.   Resulting Agency  [facility]       Narrative Performed by: Verdell Carmine Performed at:  [address] [facility]  [address], [place], [place]  [id number]  Lab Director: Jolene Schimke MD, Phone:  [phone]    Specimen Collected: 07/01/21 10:00 Last Resulted: 07/08/21 09:35       Component Ref Range & Units 3 mo ago 4 yr ago 5 yr ago  Hgb A1c MFr Bld 4.8 - 5.6 % 5.9 High   5.5 CM  6.0 High  CM   Comment:          Prediabetes: 5.7 - 6.4           Diabetes: >6.4           Glycemic control for adults with diabetes: <7.0   Resulting Agency  [facility]       Narrative Performed by: Verdell Carmine Performed at:  [address] - [facility]  [address] [place], [place]  [id number]  Lab Director: Jolene Schimke MD, Phone:  [phone]    Specimen Collected: 07/01/21 10:00 Last Resulted: 07/02/21 05:36           Assessment & Plan:  A-low back strain initial encounter, muscle spasms, low vitamin D and prediabetes  P-cyclobenazeprine/flexeril 10mg  sig t1/2-1 po TID prn muscle spasms #30 RF0 dispensed from [facility].  Ibuprofen 800mg  po TID prn pain #30 RF0 dispensed from [facility].  Avoid alcohol intake and driving while taking cyclobenazeprine/flexeril as  drowsiness common side effect.  Slow position changes as medication also lower blood pressure.  Home stretches demonstrated to patient-e.g. cobra, superman alternating arms/legs, chest stretches, neck AROM, chin tucks, knee to chest and rock side to side on back. Self massage or professional prn, foam roller use or tennis/racquetball.  Heat/cryotherapy 15 minutes QID prn.  Trial  thermacare 1 applied and another given to patient for use tomorrow from clinic stock.  Biofreeze gel apply topical affected area up to QID given 4 UD from clinic stock.  Consider epsom salt soak after work.  Discussed prolonged static positions e.g. sitting/lying typically worsen pain.  Consider physical therapy referral if no improvement with prescribed therapy from [facility] and/or chiropractic care.  Ensure ergonomics correct desk at work avoid repetitive motions if possible/holding phone/laptop in hand use desk/stand and/or break up lifting items into smaller loads/weights.  Patient was instructed to rest, ice, and ROM exercises.  Activity as tolerated.   Follow up if symptoms persist or worsen especially if loss of bowel/bladder control, arm/leg weakness and/or saddle paresthesias.  Exitcare handout on muscle spasms and low back strain with rehab exercises printed and given to patient. If unable to perform duties/needs restrictions will need appt with worker's comp clinic provider RN Rolly Salter to schedule as contract limitations prohibit me from writing work restrictions in this clinic. Patient verbalized agreement and understanding of treatment plan and had no further questions at this time.  P2:  Injury Prevention and Fitness.   Will contact patient with lab results and further instructions tomorrow via my chart or telephone. Samples sent to [facility] today. Patient verbalized understanding information/instructions, agreed with plan of care and had no further questions at this time.

## 2021-10-08 NOTE — Patient Instructions (Addendum)
Lumbar Strain A lumbar strain, which is sometimes called a low-back strain, is a stretch or tear in a muscle or the strong cords of tissue that attach muscle to bone (tendons) in the lower back (lumbar spine). This type of injury occurs when muscles or tendons are torn or are stretched beyond their limits. Lumbar strains can range from mild to severe. Mild strains may involve stretching a muscle or tendon without tearing it. These may heal in 1-2 weeks. More severe strains involve tearing of muscle fibers or tendons. These will cause more pain and may take 6-8 weeks to heal. What are the causes? This condition may be caused by: Trauma, such as a fall or a hit to the body. Twisting or overstretching the back. This may result from doing activities that need a lot of energy, such as lifting heavy objects. What increases the risk? This injury is more common in: Athletes. People with obesity. People who do repeated lifting, bending, or other movements that involve their back. What are the signs or symptoms? Symptoms of this condition may include: Sharp or dull pain in the lower back that does not go away. The pain may extend to the buttocks. Stiffness or limited range of motion. Sudden muscle tightening (spasms). How is this diagnosed? This condition may be diagnosed based on: Your symptoms. Your medical history. A physical exam. Imaging tests, such as: X-rays. MRI. How is this treated? Treatment for this condition may include: Rest. Applying heat and cold to the affected area. Over-the-counter medicines to help relieve pain and inflammation, such as NSAIDs. Prescription pain medicine and muscle relaxants may be needed for a short time. Physical therapy. Follow these instructions at home: Managing pain, stiffness, and swelling   If directed, put ice on the injured area during the first 24 hours after your injury. Put ice in a plastic bag. Place a towel between your skin and the  bag. Leave the ice on for 20 minutes, 2-3 times a day. If directed, apply heat to the affected area as often as told by your health care provider. Use the heat source that your health care provider recommends, such as a moist heat pack or a heating pad. Place a towel between your skin and the heat source. Leave the heat on for 20-30 minutes. Remove the heat if your skin turns bright red. This is especially important if you are unable to feel pain, heat, or cold. You may have a greater risk of getting burned. Activity Rest and return to your normal activities as told by your health care provider. Ask your health care provider what activities are safe for you. Do exercises as told by your health care provider. Medicines Take over-the-counter and prescription medicines only as told by your health care provider. Ask your health care provider if the medicine prescribed to you: Requires you to avoid driving or using heavy machinery. Can cause constipation. You may need to take these actions to prevent or treat constipation: Drink enough fluid to keep your urine pale yellow. Take over-the-counter or prescription medicines. Eat foods that are high in fiber, such as beans, whole grains, and fresh fruits and vegetables. Limit foods that are high in fat and processed sugars, such as fried or sweet foods. Injury prevention To prevent a future low-back injury: Always warm up properly before physical activity or sports. Cool down and stretch after being active. Use correct form when playing sports and lifting heavy objects. Bend your knees before you lift heavy objects. Use  good posture when sitting and standing. Stay physically fit and keep a healthy weight. Do at least 150 minutes of moderate-intensity exercise each week, such as brisk walking or water aerobics. Do strength exercises at least 2 times each week.  General instructions Do not use any products that contain nicotine or tobacco, such as  cigarettes, e-cigarettes, and chewing tobacco. If you need help quitting, ask your health care provider. Keep all follow-up visits as told by your health care provider. This is important. Contact a health care provider if: Your back pain does not improve after 6 weeks of treatment. Your symptoms get worse. Get help right away if: Your back pain is severe. You are unable to stand or walk. You develop pain in your legs. You develop weakness in your buttocks or legs. You have difficulty controlling when you urinate or when you have a bowel movement. You have frequent, painful, or bloody urination. You have a temperature over 101.9F (38.3C) Summary A lumbar strain, which is sometimes called a low-back strain, is a stretch or tear in a muscle or the strong cords of tissue that attach muscle to bone (tendons) in the lower back (lumbar spine). This type of injury occurs when muscles or tendons are torn or are stretched beyond their limits. Rest and return to your normal activities as told by your health care provider. If directed, apply heat and ice to the affected area as often as told by your health care provider. Take over-the-counter and prescription medicines only as told by your health care provider. Contact a health care provider if you have new or worsening symptoms. This information is not intended to replace advice given to you by your health care provider. Make sure you discuss any questions you have with your health care provider. Document Revised: 10/07/2018 Document Reviewed: 10/07/2018 Elsevier Patient Education  2022 [employer] Muscle Cramps and Spasms Muscle cramps and spasms occur when a muscle or muscles tighten and you have no control over this tightening (involuntary muscle contraction). They are a common problem and can develop in any muscle. The most common place is in the calf muscles of the leg. Muscle cramps and muscle spasms are both involuntary muscle contractions,  but there are some differences between the two: Muscle cramps are painful. They come and go and may last for a few seconds or up to 15 minutes. Muscle cramps are often more forceful and last longer than muscle spasms. Muscle spasms may or may not be painful. They may also last just a few seconds or much longer. Certain medical conditions, such as diabetes or Parkinson's disease, can make it more likely to develop cramps or spasms. However, cramps or spasms are usually not caused by a serious underlying problem. Common causes include: Doing more physical work or exercise than your body is ready for (overexertion). Overuse from repeating certain movements too many times. Remaining in a certain position for a long period of time. Improper preparation, form, or technique while playing a sport or doing an activity. Dehydration. Injury. Side effects of some medicines. Abnormally low levels of the salts and minerals in your blood (electrolytes), especially potassium and calcium. This could happen if you are taking water pills (diuretics) or if you are pregnant. In many cases, the cause of muscle cramps or spasms is not known. Follow these instructions at home: Managing pain and stiffness   Try massaging, stretching, and relaxing the affected muscle. Do this for several minutes at a time. If directed, apply heat  to tight or tense muscles as often as told by your health care provider. Use the heat source that your health care provider recommends, such as a moist heat pack or a heating pad. Place a towel between your skin and the heat source. Leave the heat on for 20-30 minutes. Remove the heat if your skin turns bright red. This is especially important if you are unable to feel pain, heat, or cold. You may have a greater risk of getting burned. If directed, put ice on the affected area. This may help if you are sore or have pain after a cramp or spasm. Put ice in a plastic bag. Place a towel between  your skin and the bag. Leave the ice on for 20 minutes, 2-3 times a day. Try taking hot showers or baths to help relax tight muscles. Eating and drinking Drink enough fluid to keep your urine pale yellow. Staying well hydrated may help prevent cramps or spasms. Eat a healthy diet that includes plenty of nutrients to help your muscles function. A healthy diet includes fruits and vegetables, lean protein, whole grains, and low-fat or nonfat dairy products. General instructions If you are having frequent cramps, avoid intense exercise for several days. Take over-the-counter and prescription medicines only as told by your health care provider. Pay attention to any changes in your symptoms. Keep all follow-up visits as told by your health care provider. This is important. Contact a health care provider if: Your cramps or spasms get more severe or happen more often. Your cramps or spasms do not improve over time. Summary Muscle cramps and spasms occur when a muscle or muscles tighten and you have no control over this tightening (involuntary muscle contraction). The most common place for cramps or spasms to occur is in the calf muscles of the leg. Massaging, stretching, and relaxing the affected muscle may relieve the cramp or spasm. Drink enough fluid to keep your urine pale yellow. Staying well hydrated may help prevent cramps or spasms. This information is not intended to replace advice given to you by your health care provider. Make sure you discuss any questions you have with your health care provider. Document Revised: 05/03/2018 Document Reviewed: 05/03/2018 Elsevier Patient Education  2022 [employer] Low Back Sprain or Strain Rehab Ask your health care provider which exercises are safe for you. Do exercises exactly as told by your health care provider and adjust them as directed. It is normal to feel mild stretching, pulling, tightness, or discomfort as you do these exercises. Stop right  away if you feel sudden pain or your pain gets worse. Do not begin these exercises until told by your health care provider. Stretching and range-of-motion exercises These exercises warm up your muscles and joints and improve the movement and flexibility of your back. These exercises also help to relieve pain, numbness, and tingling. Lumbar rotation  Lie on your back on a firm bed or the floor with your knees bent. Straighten your arms out to your sides so each arm forms a 90-degree angle (right angle) with a side of your body. Slowly move (rotate) both of your knees to one side of your body until you feel a stretch in your lower back (lumbar). Try not to let your shoulders lift off the floor. Hold this position for ______5-15____ seconds. Tense your abdominal muscles and slowly move your knees back to the starting position. Repeat this exercise on the other side of your body. Repeat _____3_____ times. Complete this exercise ____2______  times a day. Single knee to chest  Lie on your back on a firm bed or the floor with both legs straight. Bend one of your knees. Use your hands to move your knee up toward your chest until you feel a gentle stretch in your lower back and buttock. Hold your leg in this position by holding on to the front of your knee. Keep your other leg as straight as possible. Hold this position for ______5-15____ seconds. Slowly return to the starting position. Repeat with your other leg. Repeat ______3____ times. Complete this exercise _____2_____ times a day. Prone extension on elbows  Lie on your abdomen on a firm bed or the floor (prone position). Prop yourself up on your elbows. Use your arms to help lift your chest up until you feel a gentle stretch in your abdomen and your lower back. This will place some of your body weight on your elbows. If this is uncomfortable, try stacking pillows under your chest. Your hips should stay down, against the surface that you are  lying on. Keep your hip and back muscles relaxed. Hold this position for ___5-15_______ seconds. Slowly relax your upper body and return to the starting position. Repeat ___3_______ times. Complete this exercise ______2____ times a day. Strengthening exercises These exercises build strength and endurance in your back. Endurance is the ability to use your muscles for a long time, even after they get tired. Pelvic tilt This exercise strengthens the muscles that lie deep in the abdomen. Lie on your back on a firm bed or the floor with your legs extended. Bend your knees so they are pointing toward the ceiling and your feet are flat on the floor. Tighten your lower abdominal muscles to press your lower back against the floor. This motion will tilt your pelvis so your tailbone points up toward the ceiling instead of pointing to your feet or the floor. To help with this exercise, you may place a small towel under your lower back and try to push your back into the towel. Hold this position for ____5-15______ seconds. Let your muscles relax completely before you repeat this exercise. Repeat ____3______ times. Complete this exercise ______2____ times a day. Alternating arm and leg raises  Get on your hands and knees on a firm surface. If you are on a hard floor, you may want to use padding, such as an exercise mat, to cushion your knees. Line up your arms and legs. Your hands should be directly below your shoulders, and your knees should be directly below your hips. Lift your left leg behind you. At the same time, raise your right arm and straighten it in front of you. Do not lift your leg higher than your hip. Do not lift your arm higher than your shoulder. Keep your abdominal and back muscles tight. Keep your hips facing the ground. Do not arch your back. Keep your balance carefully, and do not hold your breath. Hold this position for ____5-15______ seconds. Slowly return to the starting  position. Repeat with your right leg and your left arm. Repeat _____3_____ times. Complete this exercise _____2_____ times a day. Abdominal set with straight leg raise  Lie on your back on a firm bed or the floor. Bend one of your knees and keep your other leg straight. Tense your abdominal muscles and lift your straight leg up, 4-6 inches (10-15 cm) off the ground. Keep your abdominal muscles tight and hold this position for ____5-15______ seconds. Do not hold your breath. Do  not arch your back. Keep it flat against the ground. Keep your abdominal muscles tense as you slowly lower your leg back to the starting position. Repeat with your other leg. Repeat ____3______ times. Complete this exercise ____2______ times a day. Single leg lower with bent knees Lie on your back on a firm bed or the floor. Tense your abdominal muscles and lift your feet off the floor, one foot at a time, so your knees and hips are bent in 90-degree angles (right angles). Your knees should be over your hips and your lower legs should be parallel to the floor. Keeping your abdominal muscles tense and your knee bent, slowly lower one of your legs so your toe touches the ground. Lift your leg back up to return to the starting position. Do not hold your breath. Do not let your back arch. Keep your back flat against the ground. Repeat with your other leg. Repeat ____3______ times. Complete this exercise ____2______ times a day. Posture and body mechanics Good posture and healthy body mechanics can help to relieve stress in your body's tissues and joints. Body mechanics refers to the movements and positions of your body while you do your daily activities. Posture is part of body mechanics. Good posture means: Your spine is in its natural S-curve position (neutral). Your shoulders are pulled back slightly. Your head is not tipped forward (neutral). Follow these guidelines to improve your posture and body mechanics in  your everyday activities. Standing  When standing, keep your spine neutral and your feet about hip-width apart. Keep a slight bend in your knees. Your ears, shoulders, and hips should line up. When you do a task in which you stand in one place for a long time, place one foot up on a stable object that is 2-4 inches (5-10 cm) high, such as a footstool. This helps keep your spine neutral. Sitting  When sitting, keep your spine neutral and keep your feet flat on the floor. Use a footrest, if necessary, and keep your thighs parallel to the floor. Avoid rounding your shoulders, and avoid tilting your head forward. When working at a desk or a computer, keep your desk at a height where your hands are slightly lower than your elbows. Slide your chair under your desk so you are close enough to maintain good posture. When working at a computer, place your monitor at a height where you are looking straight ahead and you do not have to tilt your head forward or downward to look at the screen. Resting When lying down and resting, avoid positions that are most painful for you. If you have pain with activities such as sitting, bending, stooping, or squatting, lie in a position in which your body does not bend very much. For example, avoid curling up on your side with your arms and knees near your chest (fetal position). If you have pain with activities such as standing for a long time or reaching with your arms, lie with your spine in a neutral position and bend your knees slightly. Try the following positions: Lying on your side with a pillow between your knees. Lying on your back with a pillow under your knees. Lifting  When lifting objects, keep your feet at least shoulder-width apart and tighten your abdominal muscles. Bend your knees and hips and keep your spine neutral. It is important to lift using the strength of your legs, not your back. Do not lock your knees straight out. Always ask for help to lift  heavy or awkward objects. This information is not intended to replace advice given to you by your health care provider. Make sure you discuss any questions you have with your health care provider. Document Revised: 02/25/2021 Document Reviewed: 02/25/2021 Elsevier Patient Education  2022 [employer].

## 2021-10-09 LAB — HEMOGLOBIN A1C
Est. average glucose Bld gHb Est-mCnc: 117 mg/dL
Hgb A1c MFr Bld: 5.7 % — ABNORMAL HIGH (ref 4.8–5.6)

## 2021-10-09 LAB — VITAMIN D 25 HYDROXY (VIT D DEFICIENCY, FRACTURES): Vit D, 25-Hydroxy: 20.4 ng/mL — ABNORMAL LOW (ref 30.0–100.0)

## 2021-10-09 MED ORDER — CHOLECALCIFEROL 1.25 MG (50000 UT) PO CAPS: 50000.0000 [IU] | ORAL_CAPSULE | ORAL | 0 refills | Status: DC

## 2021-10-09 NOTE — Addendum Note (Signed)
Addended by: Albina Billet A on: 10/09/2021 10:26 AM   Modules accepted: Orders

## 2021-10-10 NOTE — Progress Notes (Signed)
Appt made for 12/30/21 for 1015 for nonfasting repeat labs and sent to pt by email.

## 2021-10-10 NOTE — Progress Notes (Signed)
Noted follow up labs scheduled 

## 2021-10-17 NOTE — Progress Notes (Signed)
Reviewed Vitamin D and Hgba1c results with patient and trends.  She is currently not taking Vitamin D supplement and has not picked up new Rx that was sent in but plans to do so soon.  Patient on light duty at this time due to back pain and working in [employer] was given work restrictions by [employer] x 2 weeks.  Patient asking if 2 week light duty started on her first day at work or when provider wrote note.  Discussed with patient typically it starts on date of letter but HR can clarify for her.  Patient instructed to follow up with Tonya in HR.  Patient verbalized understanding information/instructions, agreed with plan and had no further questions at this time.

## 2021-12-03 DIAGNOSIS — M5416 Radiculopathy, lumbar region: Secondary | ICD-10-CM | POA: Insufficient documentation

## 2021-12-03 DIAGNOSIS — M545 Low back pain, unspecified: Secondary | ICD-10-CM | POA: Insufficient documentation

## 2021-12-10 ENCOUNTER — Encounter: Payer: Self-pay | Admitting: Registered Nurse

## 2021-12-10 ENCOUNTER — Other Ambulatory Visit: Payer: Self-pay

## 2021-12-10 ENCOUNTER — Ambulatory Visit: Payer: Self-pay | Admitting: Registered Nurse

## 2021-12-10 VITALS — BP 132/82 | HR 92 | Temp 99.6°F

## 2021-12-10 DIAGNOSIS — H6593 Unspecified nonsuppurative otitis media, bilateral: Secondary | ICD-10-CM

## 2021-12-10 MED ORDER — AMOXICILLIN-POT CLAVULANATE 875-125 MG PO TABS: 1.0000 | ORAL_TABLET | Freq: Two times a day (BID) | ORAL | 0 refills | Status: AC

## 2021-12-10 NOTE — Progress Notes (Signed)
Subjective:    Patient ID: Shari Shields, female    DOB: 1976/08/29, 45 y.o.   MRN: [id number]  45y/o African American established female pt c/o L ear pain with associated mandibular pain, especially when chewing which she reports is a sign for her that she is developing an ear infection. Denied ear discharge/fever/chills/headache/enlarged lymph nodes.     Review of Systems  Constitutional:  Negative for activity change, appetite change, chills, diaphoresis, fatigue and fever.  HENT:  Positive for ear pain. Negative for congestion, dental problem, ear discharge, facial swelling, hearing loss, mouth sores, nosebleeds, postnasal drip, rhinorrhea, sinus pressure, sinus pain, sneezing, sore throat, tinnitus, trouble swallowing and voice change.   Eyes:  Negative for photophobia and visual disturbance.  Respiratory:  Negative for cough, shortness of breath, wheezing and stridor.   Cardiovascular:  Negative for chest pain.  Gastrointestinal:  Negative for diarrhea, nausea and vomiting.  Endocrine: Negative for cold intolerance and heat intolerance.  Genitourinary:  Negative for difficulty urinating.  Musculoskeletal:  Negative for gait problem, neck pain and neck stiffness.  Skin:  Negative for color change, rash and wound.  Allergic/Immunologic: Positive for environmental allergies. Negative for food allergies.  Neurological:  Negative for dizziness, tremors, seizures, syncope, facial asymmetry, speech difficulty, weakness, light-headedness, numbness and headaches.  Hematological:  Negative for adenopathy. Does not bruise/bleed easily.  Psychiatric/Behavioral:  Negative for agitation, confusion and sleep disturbance.       Objective:   Physical Exam Vitals and nursing note reviewed.  Constitutional:      General: She is awake. She is not in acute distress.    Appearance: Normal appearance. She is well-developed and well-groomed. She is obese. She is not ill-appearing, toxic-appearing  or diaphoretic.  HENT:     Head: Normocephalic and atraumatic.     Jaw: There is normal jaw occlusion. Pain on movement present. No trismus, tenderness, swelling or malocclusion.     Salivary Glands: Right salivary gland is not diffusely enlarged or tender. Left salivary gland is not diffusely enlarged or tender.     Right Ear: Hearing and external ear normal. No decreased hearing noted. Swelling present. No laceration or drainage. A middle ear effusion is present. There is no impacted cerumen. No foreign body. No mastoid tenderness. No hemotympanum. Tympanic membrane is injected, erythematous and bulging. Tympanic membrane is not scarred or perforated.     Left Ear: Hearing and external ear normal. No decreased hearing noted. Swelling present. No laceration or drainage. A middle ear effusion is present. There is no impacted cerumen. No foreign body. No mastoid tenderness. No hemotympanum. Tympanic membrane is injected, erythematous and bulging. Tympanic membrane is not scarred or perforated.     Ears:     Comments: Bilateral TMs erythema centrally and canal 6 o'clock; air fluid level bilateral TMs with some cloudiness    Nose: Mucosal edema, congestion and rhinorrhea present. No nasal deformity, septal deviation or laceration. Rhinorrhea is clear.     Right Turbinates: Not enlarged, swollen or pale.     Left Turbinates: Not enlarged, swollen or pale.     Right Sinus: No maxillary sinus tenderness or frontal sinus tenderness.     Left Sinus: No maxillary sinus tenderness or frontal sinus tenderness.     Mouth/Throat:     Lips: Pink. No lesions.     Mouth: Mucous membranes are moist. Mucous membranes are not pale, not dry and not cyanotic. No lacerations, oral lesions or angioedema.     Dentition: No  dental caries, dental abscesses or gum lesions.     Tongue: No lesions. Tongue does not deviate from midline.     Palate: No mass and lesions.     Pharynx: Uvula midline. Pharyngeal swelling and  posterior oropharyngeal erythema present. No oropharyngeal exudate or uvula swelling.     Tonsils: No tonsillar exudate or tonsillar abscesses. 0 on the right. 0 on the left.     Comments: Cobblestoning posterior pharynx; bilateral allergic shiners Eyes:     General: Lids are normal. Vision grossly intact. Gaze aligned appropriately. Allergic shiner present. No scleral icterus.       Right eye: No foreign body, discharge or hordeolum.        Left eye: No foreign body, discharge or hordeolum.     Extraocular Movements: Extraocular movements intact.     Right eye: Normal extraocular motion and no nystagmus.     Left eye: Normal extraocular motion and no nystagmus.     Conjunctiva/sclera: Conjunctivae normal.     Right eye: Right conjunctiva is not injected. No chemosis, exudate or hemorrhage.    Left eye: Left conjunctiva is not injected. No chemosis, exudate or hemorrhage.    Pupils: Pupils are equal, round, and reactive to light. Pupils are equal.     Right eye: Pupil is round and reactive.     Left eye: Pupil is round and reactive.  Neck:     Thyroid: No thyroid mass or thyromegaly.     Trachea: Trachea and phonation normal. No tracheal tenderness or tracheal deviation.  Cardiovascular:     Rate and Rhythm: Normal rate and regular rhythm.     Pulses:          Radial pulses are 2+ on the right side and 2+ on the left side.  Pulmonary:     Effort: Pulmonary effort is normal. No accessory muscle usage or respiratory distress.     Breath sounds: Normal breath sounds and air entry. No stridor, decreased air movement or transmitted upper airway sounds. No decreased breath sounds, wheezing, rhonchi or rales.     Comments: Spoke full sentences without difficulty; no cough observed in clinic Chest:     Chest wall: No tenderness.  Abdominal:     General: There is no distension.     Palpations: Abdomen is soft.  Musculoskeletal:        General: No tenderness. Normal range of motion.     Right  shoulder: Normal.     Left shoulder: Normal.     Right hand: Normal.     Left hand: Normal.     Cervical back: Normal range of motion and neck supple. No swelling, edema, deformity, erythema, signs of trauma, lacerations, rigidity, spasms, torticollis, tenderness or crepitus. No pain with movement or spinous process tenderness. Normal range of motion.     Thoracic back: No swelling or edema.     Right hip: Normal.     Left hip: Normal.     Right knee: Normal.     Left knee: Normal.  Lymphadenopathy:     Head:     Right side of head: No submental, submandibular, tonsillar, preauricular, posterior auricular or occipital adenopathy.     Left side of head: No submental, submandibular, tonsillar, preauricular, posterior auricular or occipital adenopathy.     Cervical: No cervical adenopathy.     Right cervical: No superficial, deep or posterior cervical adenopathy.    Left cervical: No superficial, deep or posterior cervical adenopathy.  Skin:  General: Skin is warm and dry.     Capillary Refill: Capillary refill takes less than 2 seconds.     Coloration: Skin is not ashen, cyanotic, jaundiced, mottled, pale or sallow.     Findings: No abrasion, abscess, acne, bruising, burn, ecchymosis, erythema, signs of injury, laceration, lesion, petechiae, rash or wound.     Nails: There is no clubbing.  Neurological:     General: No focal deficit present.     Mental Status: She is alert and oriented to person, place, and time. Mental status is at baseline. She is not disoriented.     GCS: GCS eye subscore is 4. GCS verbal subscore is 5. GCS motor subscore is 6.     Cranial Nerves: Cranial nerves 2-12 are intact. No cranial nerve deficit, dysarthria or facial asymmetry.     Sensory: Sensation is intact. No sensory deficit.     Motor: Motor function is intact. No weakness, tremor, atrophy, abnormal muscle tone or seizure activity.     Coordination: Coordination is intact. Coordination normal.      Gait: Gait is intact. Gait normal.     Comments: In/out of chair and on/off exam table without difficulty; bilateral hand grasp equal 5/5; gait sure and steady in clinic  Psychiatric:        Attention and Perception: Attention and perception normal.        Mood and Affect: Mood and affect normal.        Speech: Speech normal.        Behavior: Behavior normal. Behavior is cooperative.        Thought Content: Thought content normal.        Cognition and Memory: Cognition and memory normal.        Judgment: Judgment normal.          Assessment & Plan:  A-bilateral otitis media acute  P-Supportive treatment. Augmentin 875mg  po BID x 10 days #20 RF0 dispensed from [facility] to patient  Tylenol 1000mg  po QID prn pain/fever.   No evidence of invasive bacterial infection, non toxic and well hydrated.  This is most likely self limiting viral infection.  I do not see where any further testing or imaging is necessary at this time.   I will suggest supportive care, rest, good hygiene and encourage the patient to take adequate fluids.  The patient is to return to clinic or EMERGENCY ROOM if symptoms worsen or change significantly e.g. ear pain, fever, purulent discharge from ears or bleeding.  Exitcare handout on otitis media   Patient verbalized agreement and understanding of treatment plan and had no further questions at this time.

## 2021-12-10 NOTE — Patient Instructions (Signed)

## 2022-01-03 ENCOUNTER — Other Ambulatory Visit: Payer: Self-pay | Admitting: *Deleted

## 2022-01-03 ENCOUNTER — Other Ambulatory Visit: Payer: Self-pay

## 2022-01-03 DIAGNOSIS — R7303 Prediabetes: Secondary | ICD-10-CM

## 2022-01-03 DIAGNOSIS — R7989 Other specified abnormal findings of blood chemistry: Secondary | ICD-10-CM

## 2022-01-05 ENCOUNTER — Telehealth: Payer: Self-pay | Admitting: Registered Nurse

## 2022-01-05 ENCOUNTER — Encounter: Payer: Self-pay | Admitting: Registered Nurse

## 2022-01-05 DIAGNOSIS — U071 COVID-19: Secondary | ICD-10-CM

## 2022-01-05 LAB — VITAMIN D 25 HYDROXY (VIT D DEFICIENCY, FRACTURES): Vit D, 25-Hydroxy: 18.1 ng/mL — ABNORMAL LOW (ref 30.0–100.0)

## 2022-01-05 LAB — HGB A1C W/O EAG: Hgb A1c MFr Bld: 5.8 % — ABNORMAL HIGH (ref 4.8–5.6)

## 2022-01-05 MED ORDER — CHOLECALCIFEROL 1.25 MG (50000 UT) PO CAPS: 50000.0000 [IU] | ORAL_CAPSULE | ORAL | 1 refills | Status: DC

## 2022-01-05 MED ORDER — LORATADINE 10 MG PO TABS: 10.0000 mg | ORAL_TABLET | Freq: Every day | ORAL | 3 refills | Status: DC

## 2022-01-05 MED ORDER — SALINE SPRAY 0.65 % NA SOLN: 2.0000 | NASAL | 0 refills | Status: DC

## 2022-01-05 MED ORDER — FLUTICASONE PROPIONATE 50 MCG/ACT NA SUSP: 1.0000 | Freq: Two times a day (BID) | NASAL | 6 refills | Status: DC

## 2022-01-05 MED ORDER — MONTELUKAST SODIUM 10 MG PO TABS: 10.0000 mg | ORAL_TABLET | Freq: Every day | ORAL | 3 refills | Status: DC

## 2022-01-05 NOTE — Telephone Encounter (Signed)
Latest Reference Range & Units 07/01/21 10:00  Sodium 134 - 144 mmol/L 138  Potassium 3.5 - 5.2 mmol/L 4.3  Chloride 96 - 106 mmol/L 103  Glucose 65 - 99 mg/dL 73  BUN 6 - 24 mg/dL 9  Creatinine 0.57 - 1.00 mg/dL 0.76  Calcium 8.7 - 10.2 mg/dL 9.3  BUN/Creatinine Ratio 9 - 23  12  eGFR >59 mL/min/1.73 99  Phosphorus 3.0 - 4.3 mg/dL 3.3  Alkaline Phosphatase 44 - 121 IU/L 58  Albumin 3.8 - 4.8 g/dL 4.5  Albumin/Globulin Ratio 1.2 - 2.2  1.5  Uric Acid 2.6 - 6.2 mg/dL 4.0  AST 0 - 40 IU/L 14  ALT 0 - 32 IU/L 14  Total Protein 6.0 - 8.5 g/dL 7.6  Total Bilirubin 0.0 - 1.2 mg/dL 0.2  GGT 0 - 60 IU/L 17  Estimated CHD Risk 0.0 - 1.0 times avg.  < 0.5  LDH 119 - 226 IU/L 216  Total CHOL/HDL Ratio 0.0 - 4.4 ratio 3.3  Cholesterol, Total 100 - 199 mg/dL 210 (H)  HDL Cholesterol >39 mg/dL 64  Triglycerides 0 - 149 mg/dL 60  VLDL Cholesterol Cal 5 - 40 mg/dL 11  LDL Chol Calc (NIH) 0 - 99 mg/dL 135 (H)  Iron 27 - 159 ug/dL 45  Vitamin D, 25-Hydroxy 30.0 - 100.0 ng/mL 20.4 (L)  Globulin, Total 1.5 - 4.5 g/dL 3.1  WBC 3.4 - 10.8 x10E3/uL 5.7  RBC 3.77 - 5.28 x10E6/uL 4.65  Hemoglobin 11.1 - 15.9 g/dL 13.3  HCT 34.0 - 46.6 % 41.9  MCV 79 - 97 fL 90  MCH 26.6 - 33.0 pg 28.6  MCHC 31.5 - 35.7 g/dL 31.7  RDW 11.7 - 15.4 % 13.1  Platelets 150 - 450 x10E3/uL 355  Neutrophils Not Estab. % 59  Immature Granulocytes Not Estab. % 0  NEUT# 1.4 - 7.0 x10E3/uL 3.4  Lymphocyte # 0.7 - 3.1 x10E3/uL 1.9  Monocytes Absolute 0.1 - 0.9 x10E3/uL 0.3  Basophils Absolute 0.0 - 0.2 x10E3/uL 0.1  Immature Grans (Abs) 0.0 - 0.1 x10E3/uL 0.0  Lymphs Not Estab. % 34  Monocytes Not Estab. % 5  Basos Not Estab. % 1  Eos Not Estab. % 1  EOS (ABSOLUTE) 0.0 - 0.4 x10E3/uL 0.0  Hemoglobin A1C 4.8 - 5.6 % 5.9 (H)  TSH 0.450 - 4.500 uIU/mL 1.260  Thyroxine (T4) 4.5 - 12.0 ug/dL 7.4  Free Thyroxine Index 1.2 - 4.9  2.4  T3 Uptake Ratio 24 - 39 % 32  (H): Data is abnormally high (L): Data is abnormally  low  [employer] Tim notified NP that pt reported positive home test 01/05/2022 Day 0 congestion, fatigue.  Patient did not develop symptoms of  trouble breathing, chest pain, nausea, vomiting, diarrhea, sore throat, HA, body aches, fever or chills.   5 day quarantine per [facility] recommendations. Day 1 of quarantine is 01/06/2022. Patient to contact me if vomiting after coughing or unable to tolerate po fluids.  Discussed flu and other viral illnesses circulating in community and some causing GI upset.  If GI upset I have recommended clear fluids then bland diet.  Avoid dairy/spicy, fried and large portions of meat while having nausea.  If vomiting hold po intake x 1 hour.  Then sips clear fluids like broths, ginger ale, power ade, gatorade, pedialyte may advance to soft/bland if no vomiting x 24 hours and appetite returned otherwise hydration main focus. Call me at work from home number if symptoms not improved with plan  of care  patient to call if high fever, dehydration, marked weakness, fainting, increased abdominal pain, blood in stool or vomit (red or black).     Reviewed possible Covid symptoms including cough, shortness of breath with exertion or at rest, runny nose, congestion, sinus pain/pressure, sore throat, fever/chills, body aches, fatigue, loss of taste/smell, GI symptoms of nausea/vomiting/diarrhea. Also reviewed same day/emergent eval/ER precautions of dizziness/syncope, confusion, blue tint to lips/face, severe shortness of breath/difficulty breathing/wheezing.    Patient to isolate in own room and if possible use only one bathroom if living with others in home.  Wear mask when out of room to help prevent spread to others in household.  Sanitize high touch surfaces with lysol/chlorox/bleach spray or wipes daily as viruses are known to live on surfaces from 24 hours to days.  Patient stated spouse had her put on mask immediately after positive test.  Discussed spouse should test again in 5  days unless symptoms develop then home test sooner.  Patient asked how to get more covid tests discussed show Risk analyst insurance at [employer], [place] or [employer] and get obtain 8 tests per month.  Ordered 4 free government tests for patient from [place] site today.  Confirmation below.  Discussed with patient to use drive through if available at pharmacy otherwise sanitize hands/wear well fitting mask and stay shortest time possible in pharmacy.  Patient has risk factors for severe covid on prednisone taper - 2nd round from orthopedics currently on $RemoveBefo'30mg'QNMsVRBYOXd$  dosing.  Had to stop mobic while taking prednisone.  Patient obesity, elevated BP 132/82 last office visit, prediabetes, hyperlipidemia, and low vitamin D.  Discussed covid antivirals and paxlovid preferred.  [place] her preferred pharmacy.  Pharmacy currently closed she does not want Rx sent to [facility] or [facility] at this time.  [facility] would pick up Rx tomorrow.  Discussed will call her after 0900 when pharmacy opens if paxlovid not in stock at [facility].  Refills sent for flonase 33mcg nasal spray 1 spray each nostril BID #16gm RF6, loratadine $RemoveBeforeD'10mg'rrVOqiWlpQczir$  po daily #90 RF3, singulair $RemoveBefore'10mg'mpLOYPcoixRci$  po QHS #90 RF3 and nasal saline 2 sprays each nostril q2h wa #100 RF0.  Patient able to get into her my chart.  Exitcare handouts on paxlovid, covid home care and quarantine guidelines sent to her my chart.  Discussed most common side effects paxlovid metallic taste mouth and GI upset.  Discussed taking paxlovid with lemonade may help with bad taste in mouth.  Discussed per Epocrates while on prednisone and paxlovid she should use back up contraception.  Discussed paxlovid can elevated prednisone blood levels per Epocrates also but patient on taper had started at $RemoveBe'50mg'dGRIVArhe$  and now at $Remo'30mg'jbiRR$  and decreasing to $RemoveBefor'20mg'ctPDdjMdrSKP$  per taper instructed by orthopedics.  Patient has dayquil/nyquil at home for cough/cold use and is working for him. May use flonase nasal 54mcg 1 spray each nostril  BID prn rhinitis.  Dayquil and nyquil per manufacturer instructions.  Discussed honey 1 tablespoon every 4 hours is a natural cough suppressant but caution due to his diabetes.  Avoid dehydration and drink water to keep urine pale yellow clear and voiding every 2-4 hours while awake.  Patient alert and oriented x3, spoke full sentences without difficulty.  Some nasal congestion noted.  No audible cough during 21 minute telephone call.  Discussed with patient she can contact me at this number [phone] when clinic closed.  I do have jury duty Tuesday 1/17 but RN Hildred Alamin in clinic [phone].    RN  Hildred Alamin returns to clinic on Monday 1/16 (tomorrow).   Pt verbalized understanding and agreement with plan of care. No further questions/concerns at this time. Pt reminded to contact clinic with any changes in symptoms or questions/concerns. HR notified patient to quarantine through Day 5 RTW estimated Day 6 1/20 with strict mask wear through Day 10 1/24 and no eating in employee lunch room.  Estimated return to work onsite 12/1999/14/2023   Thank You! Your order #: [id number] has been placed.  If you provided your email address, we'll send you a confirmation email, an email with the tracking number, and delivery updates.  Contact Information Kipp Brood  [email or web address]  Shipping Address [address]  [address]  [place], [place] [address]  [place]  Order Summary Free At-Home COVID-19 Tests Order of #4 tests  Subtotal $0.00 Shipping & Handling $0.00 Total $0.00

## 2022-01-05 NOTE — Addendum Note (Signed)
Addended by: Gerarda Fraction A on: 01/05/2022 08:40 PM   Modules accepted: Orders

## 2022-01-05 NOTE — Addendum Note (Signed)
Addended by: Albina Billet A on: 01/05/2022 11:44 AM   Modules accepted: Orders

## 2022-01-06 MED ORDER — NIRMATRELVIR/RITONAVIR (PAXLOVID)TABLET: 3.0000 | ORAL_TABLET | Freq: Two times a day (BID) | ORAL | 0 refills | Status: AC

## 2022-01-06 MED ORDER — MONTELUKAST SODIUM 10 MG PO TABS: 10.0000 mg | ORAL_TABLET | Freq: Every day | ORAL | 3 refills | Status: DC

## 2022-01-06 MED ORDER — FLUTICASONE PROPIONATE 50 MCG/ACT NA SUSP: 1.0000 | Freq: Two times a day (BID) | NASAL | 6 refills | Status: DC

## 2022-01-06 MED ORDER — CHOLECALCIFEROL 1.25 MG (50000 UT) PO CAPS: 50000.0000 [IU] | ORAL_CAPSULE | ORAL | 1 refills | Status: AC

## 2022-01-06 MED ORDER — LORATADINE 10 MG PO TABS: 10.0000 mg | ORAL_TABLET | Freq: Every day | ORAL | 3 refills | Status: DC

## 2022-01-06 MED ORDER — SALINE SPRAY 0.65 % NA SOLN: 2.0000 | NASAL | 0 refills | Status: DC

## 2022-01-07 NOTE — Telephone Encounter (Signed)
Patient notified via telephone paxlovid not in stock at [facility] location and Rx sent to [facility] in [place] per her second pharmacy preference along with her other Rx so she can pick up all at one location.  Patient A&Ox3 spoke full sentences without difficulty; nasal congestion audible; no cough or throat clearing noted during 2 minute call.  Patient verbalized understanding information/instructions and had no further questions at this time.

## 2022-01-08 NOTE — Telephone Encounter (Signed)
Patient contacted via telephone and she stated feeling better since starting paxlovid and cold medicine but does have metallic taste in mouth.  Trying lemon water to help with metallic taste and it helps somewhat but doesn't clear it completely.  Feeling 75% today.  Denied n/v/d/fever or chills.  Discussed RN Rolly Salter will contact her tomorrow regarding RTW 1/20.  Patient A&Ox3 spoke full sentences without difficulty; some nasal congestion noted during 3 minute call no audible cough or throat clearing.  HR notified re-evaluation 1/19 for RTW 1/20.  Patient verbalized understanding information/instructions, agreed with plan of care and had no further questions at this time.

## 2022-01-09 NOTE — Telephone Encounter (Signed)
Called pt for sx check. No answer. No VM setup.

## 2022-01-09 NOTE — Telephone Encounter (Signed)
Spoke with pt by phone. She reports feeling better overall but does not feel quite ready to RTW. Sts paxlovid makes her tired and doesn't think she would make it through entire shift while taking it. Will complete this Saturday and plan to RTW Monday 1/23 instead. Weekend f/u to confirm. Pt verbalizes agreement and denies needs or concerns.

## 2022-01-10 NOTE — Telephone Encounter (Signed)
Reviewed RN Haley note agreed with plan of care and will follow up with patient via telephone this weekend. 

## 2022-01-13 NOTE — Telephone Encounter (Signed)
Noted patient RTW 1/23 strict mask use through Day 10 1/24

## 2022-01-13 NOTE — Telephone Encounter (Signed)
Pt reports it was too late to return NP's missed call yesterday by the time she saw it.  She did RTW today 1/23 as expected. Denies any remaining sx. Denies needs or concerns. Strict mask use thru 1/24.

## 2022-01-14 ENCOUNTER — Encounter: Payer: Self-pay | Admitting: Registered Nurse

## 2022-01-14 ENCOUNTER — Other Ambulatory Visit: Payer: Self-pay

## 2022-01-14 ENCOUNTER — Ambulatory Visit: Payer: Self-pay | Admitting: Registered Nurse

## 2022-01-14 VITALS — BP 124/82 | HR 90 | Temp 99.3°F

## 2022-01-14 DIAGNOSIS — H6593 Unspecified nonsuppurative otitis media, bilateral: Secondary | ICD-10-CM

## 2022-01-14 MED ORDER — AMOXICILLIN 875 MG PO TABS: 875.0000 mg | ORAL_TABLET | Freq: Three times a day (TID) | ORAL | 0 refills | Status: AC

## 2022-01-14 MED ORDER — ACETAMINOPHEN 500 MG PO TABS: 1000.0000 mg | ORAL_TABLET | Freq: Four times a day (QID) | ORAL | 0 refills | Status: AC | PRN

## 2022-01-14 NOTE — Progress Notes (Signed)
Subjective:    Patient ID: Shari Shields, female    DOB: 31-Aug-1976, 46 y.o.   MRN: [id number]  45y/o African American established female pt presenting for f/u bilateral otalgia but L>R. Denied ear drainage or muffled sounds but still hearing bubbles/popping when chewing.  Neck pain but no enlarged lymph nodes but tender to touch;  + covid test last week      Review of Systems  Constitutional:  Positive for activity change and fatigue. Negative for appetite change, chills, diaphoresis and fever.  HENT:  Positive for ear pain, postnasal drip and rhinorrhea. Negative for congestion, ear discharge, facial swelling, hearing loss, mouth sores, sinus pressure, sinus pain, sneezing, sore throat, tinnitus and trouble swallowing.   Eyes:  Negative for photophobia and visual disturbance.  Respiratory:  Negative for chest tightness, shortness of breath, wheezing and stridor.   Cardiovascular:  Negative for chest pain and palpitations.  Gastrointestinal:  Negative for diarrhea, nausea and vomiting.  Endocrine: Negative for cold intolerance and heat intolerance.  Genitourinary:  Negative for difficulty urinating.  Musculoskeletal:  Positive for neck pain. Negative for gait problem and neck stiffness.  Skin:  Negative for color change and rash.  Allergic/Immunologic: Positive for environmental allergies. Negative for food allergies.  Neurological:  Negative for dizziness, tremors, seizures, syncope, facial asymmetry, speech difficulty, weakness, light-headedness, numbness and headaches.  Hematological:  Negative for adenopathy. Does not bruise/bleed easily.  Psychiatric/Behavioral:  Negative for agitation, confusion and sleep disturbance.       Objective:   Physical Exam Vitals and nursing note reviewed.  Constitutional:      General: She is awake. She is not in acute distress.    Appearance: Normal appearance. She is well-developed and well-groomed. She is morbidly obese. She is not  ill-appearing, toxic-appearing or diaphoretic.  HENT:     Head: Normocephalic and atraumatic.     Jaw: There is normal jaw occlusion. No pain on movement.     Salivary Glands: Right salivary gland is not diffusely enlarged or tender. Left salivary gland is not diffusely enlarged or tender.     Right Ear: Hearing and external ear normal. No decreased hearing noted. Swelling and tenderness present. No laceration or drainage. A middle ear effusion is present. There is no impacted cerumen. No foreign body. No mastoid tenderness. No PE tube. No hemotympanum. Tympanic membrane is erythematous and bulging. Tympanic membrane is not injected, scarred, perforated or retracted.     Left Ear: Hearing and external ear normal. No decreased hearing noted. Swelling and tenderness present. No laceration or drainage. A middle ear effusion is present. There is no impacted cerumen. No foreign body. No mastoid tenderness. No PE tube. No hemotympanum. Tympanic membrane is erythematous and bulging. Tympanic membrane is not injected, scarred, perforated or retracted.     Ears:     Comments: Bilateral canals auditory with erythema and TM central erythema; air fluid level noted    Nose: Rhinorrhea present. No congestion. Rhinorrhea is clear.     Right Turbinates: Enlarged and swollen. Not pale.     Left Turbinates: Enlarged and swollen. Not pale.     Right Sinus: No maxillary sinus tenderness or frontal sinus tenderness.     Left Sinus: No maxillary sinus tenderness or frontal sinus tenderness.     Mouth/Throat:     Lips: Pink. No lesions.     Mouth: Mucous membranes are moist. No oral lesions or angioedema.     Dentition: No gum lesions.  Tongue: No lesions. Tongue does not deviate from midline.     Palate: No mass and lesions.     Pharynx: Uvula midline. Pharyngeal swelling and posterior oropharyngeal erythema present. No oropharyngeal exudate or uvula swelling.     Tonsils: No tonsillar exudate.     Comments:  Cobblestoning posterior pharynx; bilateral allergic shiners; clear discharge bilateral nasal turbinates Eyes:     General: Lids are normal. Vision grossly intact. Allergic shiner present. No scleral icterus.       Right eye: No discharge.        Left eye: No discharge.     Extraocular Movements: Extraocular movements intact.     Conjunctiva/sclera: Conjunctivae normal.     Pupils: Pupils are equal, round, and reactive to light.  Neck:     Trachea: Trachea and phonation normal. No tracheal tenderness or tracheal deviation.  Cardiovascular:     Rate and Rhythm: Normal rate and regular rhythm.     Pulses: Normal pulses.          Radial pulses are 2+ on the right side and 2+ on the left side.     Heart sounds: Normal heart sounds, S1 normal and S2 normal. No murmur heard.   No friction rub. No gallop.  Pulmonary:     Effort: Pulmonary effort is normal. No respiratory distress.     Breath sounds: Normal breath sounds and air entry. No stridor, decreased air movement or transmitted upper airway sounds. No decreased breath sounds, wheezing, rhonchi or rales.     Comments: Spoke full sentences without difficulty; wearing mask in clinic; no cough observed Abdominal:     Palpations: Abdomen is soft.  Musculoskeletal:        General: Normal range of motion.     Cervical back: Normal range of motion and neck supple. Tenderness present. No swelling, edema, deformity, erythema, signs of trauma, lacerations, rigidity, spasms or crepitus. Pain with movement present. No muscular tenderness. Normal range of motion.     Thoracic back: No swelling, edema, deformity, signs of trauma or lacerations.  Lymphadenopathy:     Head:     Right side of head: Tonsillar adenopathy present. No submental, submandibular, preauricular, posterior auricular or occipital adenopathy.     Left side of head: Tonsillar adenopathy present. No submental, submandibular, preauricular, posterior auricular or occipital adenopathy.      Cervical: Cervical adenopathy present.     Right cervical: Deep cervical adenopathy present. No superficial or posterior cervical adenopathy.    Left cervical: Deep cervical adenopathy present. No superficial or posterior cervical adenopathy.     Comments: Tonsillar and deep cervical lymph nodes TTP bilaterally  Skin:    General: Skin is warm and dry.     Capillary Refill: Capillary refill takes less than 2 seconds.     Coloration: Skin is not ashen, cyanotic, jaundiced, mottled, pale or sallow.     Findings: No abrasion, abscess, acne, bruising, burn, ecchymosis, erythema, signs of injury, laceration, lesion, petechiae, rash or wound.     Nails: There is no clubbing.  Neurological:     General: No focal deficit present.     Mental Status: She is alert and oriented to person, place, and time. Mental status is at baseline.     GCS: GCS eye subscore is 4. GCS verbal subscore is 5. GCS motor subscore is 6.     Cranial Nerves: Cranial nerves 2-12 are intact. No cranial nerve deficit, dysarthria or facial asymmetry.     Sensory:  Sensation is intact. No sensory deficit.     Motor: Motor function is intact. No weakness, tremor, atrophy, abnormal muscle tone or seizure activity.     Coordination: Coordination is intact. Coordination normal.     Gait: Gait normal.     Comments: In/out of chair without difficulty; bilateral hand grasp equal 5/5; gait sure and steady in clinic  Psychiatric:        Attention and Perception: Attention and perception normal.        Mood and Affect: Mood and affect normal.        Speech: Speech normal.        Behavior: Behavior normal. Behavior is cooperative.        Thought Content: Thought content normal.        Cognition and Memory: Cognition and memory normal.        Judgment: Judgment normal.          Assessment & Plan:   A-bilateral otitis media, recurrent  P-Discussed with patient augmentin PDRx on backorder will treat with amoxicillin today.  Dosing  change.  Supportive treatment. Amoxicillin 875mg  po TID x 5 days #20 RF0 dispensed from [facility] to patient  Tylenol 1000mg  po QID prn pain/fever.   No evidence of invasive bacterial infection, non toxic and well hydrated.  This is most likely self limiting viral infection.  I do not see where any further testing or imaging is necessary at this time.   I will suggest supportive care, rest, good hygiene and encourage the patient to take adequate fluids.  The patient is to return to clinic or EMERGENCY ROOM if symptoms worsen or change significantly e.g. ear pain, fever, purulent discharge from ears or bleeding.  Exitcare handout on otitis media   Patient verbalized agreement and understanding of treatment plan and had no further questions at this time.

## 2022-01-14 NOTE — Patient Instructions (Signed)

## 2022-01-18 NOTE — Telephone Encounter (Signed)
Spoke with patient via telephone A&Ox3 spoke full sentences without difficulty; no audible cough/throat clearing/congestion during 2 minute call.  Ear pain improved.  Stated has noticed with exertion shortness of breath.  Discussed with patient could be bronchitis or covid related inflammation of airways.  See RN Hildred Alamin in clinic Monday 1/30 for re-evaluation sp02/breath sounds.  Patient does not have inhaler at home.  Discussed albuterol inhaler/oral steroids can help if bronchial inflammation/wheezing.  Amoxicillin provides some coverage for pneumonia.  Patient denied breathing problems at rest.  Patient agreed with plan of care and had no further questions at this time.

## 2022-01-20 ENCOUNTER — Ambulatory Visit: Payer: Self-pay | Admitting: *Deleted

## 2022-01-20 ENCOUNTER — Other Ambulatory Visit: Payer: Self-pay

## 2022-01-20 DIAGNOSIS — R0609 Other forms of dyspnea: Secondary | ICD-10-CM

## 2022-01-20 NOTE — Progress Notes (Signed)
Pt reported intermittent ShOB to NP Tina over the weekend by phone. Notices it after activity. Walked to clinic from dept and denies current ShOB. Lungs CTA bilaterally. SpO2 100%.  Notified NP Tina by phone. Contacted pt after clinic visit by phone, no answer. Per verbal orders, instructed pt to report to clinic if sx occur while at work and to check pulse when it occurs. If above 100 after 5 min rest, notify clinic staff. Advised to contact clinic if any questions or concerns, or needs guidance on checking her pulse.

## 2022-01-20 NOTE — Telephone Encounter (Signed)
RN Rolly Salter contacted NP via telephone.  Patient seen by RN today sp02 100% BBS CTA denied shortness of breath at work.  Discussed no inhaler or steroids indicated based on exam today.  Patient to follow up if shortness of breath occurs for re-evaluation/take pulse to see if staying over 100 when she is having shortness of breath.  Discussed with RN Rolly Salter to educate patient how to take pulse in clinic today.  RN Rolly Salter verbalized understanding information/instructions, will relay to patient.

## 2022-01-26 NOTE — Telephone Encounter (Signed)
Patient contacted via telephone to follow up dyspnea with exertion.  Patient stated it occurred yesterday again at home while washing dishes and folding clothes.  Pulse normal per patient and denied wheezing.  Discussed with patient to follow up with clinic staff if at work and shortness of breath with activity occurs.  Patient A&Ox3 spoke full sentences without difficulty.  No cough/congestion or wheezing during 3 minute telephone call.  Patient verbalized understanding information/instructions, agreed with plan of care and had no further questions at this time.

## 2022-03-06 ENCOUNTER — Ambulatory Visit: Payer: Self-pay | Admitting: Registered Nurse

## 2022-03-06 ENCOUNTER — Encounter: Payer: Self-pay | Admitting: Registered Nurse

## 2022-03-06 VITALS — BP 136/86 | HR 89 | Temp 99.7°F

## 2022-03-06 MED ORDER — ACETAMINOPHEN 500 MG PO TABS: 1000.0000 mg | ORAL_TABLET | Freq: Four times a day (QID) | ORAL | 0 refills | Status: AC | PRN

## 2022-03-06 MED ORDER — AMOXICILLIN 875 MG PO TABS: 875.0000 mg | ORAL_TABLET | Freq: Three times a day (TID) | ORAL | 0 refills | Status: AC

## 2022-03-06 MED ORDER — SALINE SPRAY 0.65 % NA SOLN: 2.0000 | NASAL | 0 refills | Status: DC

## 2022-03-06 NOTE — Patient Instructions (Signed)
Allergic Rhinitis, Adult ?Allergic rhinitis is an allergic reaction that affects the mucous membrane inside the nose. The mucous membrane is the tissue that produces mucus. ?There are two types of allergic rhinitis: ?Seasonal. This type is also called hay fever and happens only during certain seasons. ?Perennial. This type can happen at any time of the year. ?Allergic rhinitis cannot be spread from person to person. This condition can be mild, moderate, or severe. It can develop at any age and may be outgrown. ?What are the causes? ?This condition is caused by allergens. These are things that can cause an allergic reaction. Allergens may differ for seasonal allergic rhinitis and perennial allergic rhinitis. ?Seasonal allergic rhinitis is triggered by pollen. Pollen can come from grasses, trees, and weeds. ?Perennial allergic rhinitis may be triggered by: ?Dust mites. ?Proteins in a pet's urine, saliva, or dander. Dander is dead skin cells from a pet. ?Smoke, mold, or car fumes. ?What increases the risk? ?You are more likely to develop this condition if you have a family history of allergies or other conditions related to allergies, including: ?Allergic conjunctivitis. This is inflammation of parts of the eyes and eyelids. ?Asthma. This condition affects the lungs and makes it hard to breathe. ?Atopic dermatitis or eczema. This is long term (chronic) inflammation of the skin. ?Food allergies. ?What are the signs or symptoms? ?Symptoms of this condition include: ?Sneezing or coughing. ?A stuffy nose (nasal congestion), itchy nose, or nasal discharge. ?Itchy eyes and tearing of the eyes. ?A feeling of mucus dripping down the back of your throat (postnasal drip). ?Trouble sleeping. ?Tiredness or fatigue. ?Headache. ?Sore throat. ?How is this diagnosed? ?This condition may be diagnosed with your symptoms, medical history, and physical exam. Your health care provider may check for related conditions, such  as: ?Asthma. ?Pink eye. This is eye inflammation caused by infection (conjunctivitis). ?Ear infection. ?Upper respiratory infection. This is an infection in the nose, throat, or upper airways. ?You may also have tests to find out which allergens trigger your symptoms. These may include skin tests or blood tests. ?How is this treated? ?There is no cure for this condition, but treatment can help control symptoms. Treatment may include: ?Taking medicines that block allergy symptoms, such as corticosteroids and antihistamines. Medicine may be given as a shot, nasal spray, or pill. ?Avoiding any allergens. ?Being exposed again and again to tiny amounts of allergens to help you build a defense against allergens (immunotherapy). This is done if other treatments have not helped. It may include: ?Allergy shots. These are injected medicines that have small amounts of allergen in them. ?Sublingual immunotherapy. This involves taking small doses of a medicine with allergen in it under your tongue. ?If these treatments do not work, your health care provider may prescribe newer, stronger medicines. ?Follow these instructions at home: ?Avoiding allergens ?Find out what you are allergic to and avoid those allergens. These are some things you can do to help avoid allergens: ?If you have perennial allergies: ?Replace carpet with wood, tile, or vinyl flooring. Carpet can trap dander and dust. ?Do not smoke. Do not allow smoking in your home. ?Change your heating and air conditioning filters at least once a month. ?If you have seasonal allergies, take these steps during allergy season: ?Keep windows closed as much as possible. ?Plan outdoor activities when pollen counts are lowest. Check pollen counts before you plan outdoor activities. ?When coming indoors, change clothing and shower before sitting on furniture or bedding. ?If you have a pet in   the house that produces allergens: ?Keep the pet out of the bedroom. ?Vacuum, sweep, and  dust regularly. ?General instructions ?Take over-the-counter and prescription medicines only as told by your health care provider. ?Drink enough fluid to keep your urine pale yellow. ?Keep all follow-up visits as told by your health care provider. This is important. ?Where to find more information ?American Academy of Allergy, Asthma & Immunology: www.aaaai.org ?Contact a health care provider if: ?You have a fever. ?You develop a cough that does not go away. ?You make whistling sounds when you breathe (wheeze). ?Your symptoms slow you down or stop you from doing your normal activities each day. ?Get help right away if: ?You have shortness of breath. ?This symptom may represent a serious problem that is an emergency. Do not wait to see if the symptom will go away. Get medical help right away. Call your local emergency services (911 in the [place] Do not drive yourself to the hospital. ?Summary ?Allergic rhinitis may be managed by taking medicines as directed and avoiding allergens. ?If you have seasonal allergies, keep windows closed as much as possible during allergy season. ?Contact your health care provider if you develop a fever or a cough that does not go away. ?This information is not intended to replace advice given to you by your health care provider. Make sure you discuss any questions you have with your health care provider. ?Document Revised: 01/27/2020 Document Reviewed: 12/06/2019 ?Elsevier Patient Education ? 2022 [employer] ?How to Perform a Sinus Rinse ?A sinus rinse is a home treatment that is used to rinse your sinuses with a germ-free (sterile) mixture of salt and water (saline solution). Sinuses are air-filled spaces in your skull that are behind the bones of your face and forehead. They open into your nasal cavity. ?A sinus rinse can help to clear mucus, dirt, dust, or pollen from your nasal cavity. You may do a sinus rinse when you have a cold, a virus, nasal allergy symptoms, a sinus  infection, or stuffiness in your nose or sinuses. ?What are the risks? ?A sinus rinse is generally safe and effective. However, there are a few risks, which include: ?A burning sensation in your sinuses. This may happen if you do not make the saline solution as directed. Be sure to follow all directions when making the saline solution. ?Nasal irritation. ?Infection. This may be from unclean supplies or from contaminated water. Infection from contaminated water is rare, but possible. ?Do not do a sinus rinse if you have had ear or nasal surgery, ear infection, or plugged ears, unless recommended by your health care provider. ?Supplies needed: ?Saline solution or powder. ?Distilled or sterile water to mix with saline powder. ?You may use boiled and cooled tap water. Boil tap water for 5 minutes; cool until it is lukewarm. Use within 24 hours. ?Do not use regular tap water to mix with the saline solution. ?Neti pot or nasal rinse bottle. These supplies release the saline solution into your nose and through your sinuses. Neti pots and nasal rinse bottles can be purchased at your local pharmacy, a health food store, or online. ?How to perform a sinus rinse ? ?Wash your hands with soap and water for at least 20 seconds. If soap and water are not available, use hand sanitizer. ?Wash your device according to the directions that came with the product and then dry it. ?Use the solution that comes with your product or one that is sold separately in stores. Follow the mixing directions   on the package to mix with sterile or distilled water. ?Fill the device with the amount of saline solution noted in the device instructions. ?Stand by a sink and tilt your head sideways over the sink. ?Place the spout of the device in your upper nostril (the one closer to the ceiling). ?Gently pour or squeeze the saline solution into your nasal cavity. The liquid should drain out from the lower nostril if you are not too congested. ?While  rinsing, breathe through your open mouth. ?Gently blow your nose to clear any mucus and rinse solution. Blowing too hard may cause ear pain. ?Turn your head in the other direction and repeat in your other nostril. ?Clean and rinse yo

## 2022-03-06 NOTE — Progress Notes (Signed)
Subjective:    Patient ID: Shari Shields, female    DOB: 1976/07/12, 46 y.o.   MRN: [id number]  45y/o african [place] female established patient here for evaluation right ear pain and headache.  Took tylenol this am and headache resolved but ear still bothering her.  Denied discharge/bleeding.  Last ear infection Dec Augmentin and covid infection Jan 2023.     Review of Systems  Constitutional:  Negative for activity change, appetite change, chills, diaphoresis, fatigue, fever and unexpected weight change.  HENT:  Positive for congestion, ear pain and sinus pressure. Negative for dental problem, drooling, ear discharge, facial swelling, hearing loss, mouth sores, nosebleeds, postnasal drip, rhinorrhea, sinus pain, sneezing, sore throat, tinnitus, trouble swallowing and voice change.   Eyes:  Negative for photophobia, pain, discharge, redness, itching and visual disturbance.  Respiratory:  Negative for cough, choking, chest tightness, shortness of breath, wheezing and stridor.   Cardiovascular:  Negative for chest pain, palpitations and leg swelling.  Gastrointestinal:  Negative for abdominal distention, abdominal pain, blood in stool, constipation, diarrhea, nausea and vomiting.  Endocrine: Negative for cold intolerance and heat intolerance.  Genitourinary:  Negative for difficulty urinating, dysuria and hematuria.  Musculoskeletal:  Negative for arthralgias, back pain, gait problem, joint swelling, myalgias, neck pain and neck stiffness.  Skin:  Negative for color change, pallor, rash and wound.  Allergic/Immunologic: Positive for environmental allergies. Negative for food allergies.  Neurological:  Positive for headaches. Negative for dizziness, tremors, seizures, syncope, facial asymmetry, speech difficulty, weakness, light-headedness and numbness.  Hematological:  Negative for adenopathy. Does not bruise/bleed easily.  Psychiatric/Behavioral:  Negative for agitation, behavioral  problems, confusion, decreased concentration and sleep disturbance.       Objective:   Physical Exam Vitals and nursing note reviewed.  Constitutional:      General: She is awake. She is not in acute distress.    Appearance: Normal appearance. She is well-developed and well-groomed. She is obese. She is not ill-appearing, toxic-appearing or diaphoretic.  HENT:     Head: Normocephalic and atraumatic.     Jaw: There is normal jaw occlusion. No trismus.     Salivary Glands: Right salivary gland is not diffusely enlarged or tender. Left salivary gland is not diffusely enlarged or tender.     Right Ear: Hearing, ear canal and external ear normal. No decreased hearing noted. Swelling and tenderness present. No drainage. A middle ear effusion is present. There is no impacted cerumen. No foreign body. No mastoid tenderness. No PE tube. No hemotympanum. Tympanic membrane is erythematous and bulging. Tympanic membrane is not scarred or perforated.     Left Ear: Hearing, ear canal and external ear normal. A middle ear effusion is present.     Ears:     Comments: Right TM air fluid level opacity/erythema bulging and canal with erythema no debris; left TM air fluid level clear; cobblestoning posterior pharynx; bilateral allergic shiners and lower eyelids 1+/4 nonpitting edema    Nose: Mucosal edema and rhinorrhea present. No nasal deformity, septal deviation, laceration or congestion. Rhinorrhea is clear.     Right Sinus: Maxillary sinus tenderness and frontal sinus tenderness present.     Left Sinus: Maxillary sinus tenderness and frontal sinus tenderness present.     Mouth/Throat:     Lips: Pink. No lesions.     Mouth: Mucous membranes are moist. Mucous membranes are not pale, not dry and not cyanotic. No lacerations, oral lesions or angioedema.     Dentition: No dental abscesses  or gum lesions.     Tongue: No lesions. Tongue does not deviate from midline.     Palate: No mass and lesions.     Pharynx:  Uvula midline. Pharyngeal swelling and posterior oropharyngeal erythema present. No oropharyngeal exudate or uvula swelling.     Tonsils: No tonsillar abscesses. 0 on the right. 0 on the left.  Eyes:     General: Lids are normal. Vision grossly intact. Gaze aligned appropriately. Allergic shiner present. No scleral icterus.       Right eye: No foreign body, discharge or hordeolum.        Left eye: No foreign body, discharge or hordeolum.     Extraocular Movements: Extraocular movements intact.     Right eye: Normal extraocular motion and no nystagmus.     Left eye: Normal extraocular motion and no nystagmus.     Conjunctiva/sclera: Conjunctivae normal.     Right eye: Right conjunctiva is not injected. No chemosis, exudate or hemorrhage.    Left eye: Left conjunctiva is not injected. No chemosis, exudate or hemorrhage.    Pupils: Pupils are equal, round, and reactive to light. Pupils are equal.     Right eye: Pupil is round and reactive.     Left eye: Pupil is round and reactive.  Neck:     Thyroid: No thyroid mass or thyromegaly.     Trachea: Trachea and phonation normal. No tracheal tenderness or tracheal deviation.  Cardiovascular:     Rate and Rhythm: Normal rate and regular rhythm.     Pulses: Normal pulses.          Radial pulses are 2+ on the right side and 2+ on the left side.  Pulmonary:     Effort: Pulmonary effort is normal. No accessory muscle usage or respiratory distress.     Breath sounds: Normal breath sounds and air entry. No stridor or transmitted upper airway sounds. No decreased breath sounds, wheezing, rhonchi or rales.  Chest:     Chest wall: No tenderness.  Abdominal:     General: There is no distension.     Palpations: Abdomen is soft.  Musculoskeletal:        General: No tenderness. Normal range of motion.     Right shoulder: Normal.     Left shoulder: Normal.     Right hand: Normal.     Left hand: Normal.     Cervical back: Normal range of motion and neck  supple. No swelling, edema, deformity, erythema, signs of trauma, lacerations, rigidity, tenderness or crepitus. No pain with movement or muscular tenderness. Normal range of motion.     Thoracic back: No swelling, edema, deformity, signs of trauma or lacerations.     Right hip: Normal.     Left hip: Normal.     Right knee: Normal.     Left knee: Normal.  Lymphadenopathy:     Head:     Right side of head: No submental, submandibular, tonsillar, preauricular, posterior auricular or occipital adenopathy.     Left side of head: No submental, submandibular, tonsillar, preauricular, posterior auricular or occipital adenopathy.     Cervical: No cervical adenopathy.     Right cervical: No superficial, deep or posterior cervical adenopathy.    Left cervical: No superficial, deep or posterior cervical adenopathy.  Skin:    General: Skin is warm and dry.     Capillary Refill: Capillary refill takes less than 2 seconds.     Coloration: Skin is not ashen,  cyanotic, jaundiced, mottled, pale or sallow.     Findings: No abrasion, abscess, bruising, burn, ecchymosis, erythema, signs of injury, laceration, lesion, petechiae, rash or wound.     Nails: There is no clubbing.  Neurological:     General: No focal deficit present.     Mental Status: She is alert and oriented to person, place, and time. Mental status is at baseline. She is not disoriented.     GCS: GCS eye subscore is 4. GCS verbal subscore is 5. GCS motor subscore is 6.     Cranial Nerves: Cranial nerves 2-12 are intact. No cranial nerve deficit, dysarthria or facial asymmetry.     Sensory: Sensation is intact. No sensory deficit.     Motor: Motor function is intact. No weakness, tremor, atrophy, abnormal muscle tone or seizure activity.     Coordination: Coordination is intact. Coordination normal.     Gait: Gait is intact. Gait normal.     Comments: In/out of chair without difficulty; gait sure and steady in clinic; bilateral hand grasp equal  5/5  Psychiatric:        Attention and Perception: Attention and perception normal.        Mood and Affect: Mood and affect normal.        Speech: Speech normal.        Behavior: Behavior normal. Behavior is cooperative.        Thought Content: Thought content normal.        Cognition and Memory: Cognition and memory normal.        Judgment: Judgment normal.      Right TM and canal erythema air fluid level opacity 50%; frontal sinuses greater than maxillary TTP right greater than left; cobblestoning posterior pharynx; bilateral allergic shiners    Assessment & Plan:   A-right otitis media acute recurrent, seasonal allergic rhinitis, acute rhinosinusitis   P-Supportive treatment. Amoxicillin 875mg  po TID x 5 days #20 RF0 dispensed from [facility] to patient per up to date.  Tylenol 1000mg  po QID prn pain/fever.   No evidence of invasive bacterial infection, non toxic and well hydrated.  This is most likely self limiting viral infection.  I do not see where any further testing or imaging is necessary at this time.   I will suggest supportive care, rest, good hygiene and encourage the patient to take adequate fluids.  The patient is to return to clinic or EMERGENCY ROOM if symptoms worsen or change significantly e.g. ear pain, fever, purulent discharge from ears or bleeding.  Exitcare handout on otitis media   Patient verbalized agreement and understanding of treatment plan and had no further questions at this time.Marland Kitchen     Restart flonase 1 spray each nostril BID, saline 2 sprays each nostril q2h wa prn congestion.  Denied personal or family history of ENT cancer.  Shower BID especially prior to bed. No evidence of systemic bacterial infection, non toxic and well hydrated.  I do not see where any further testing or imaging is necessary at this time.   I will suggest supportive care, rest, good hygiene and encourage the patient to take adequate fluids.  The patient is to return to clinic or EMERGENCY ROOM  if symptoms worsen or change significantly.  Exitcare handout on sinusitis and sinus rinse.  Patient verbalized agreement and understanding of treatment plan and had no further questions at this time.   P2:  Hand washing and cover cough   Patient may use normal saline nasal spray 2  sprays each nostril q2h wa as needed. flonase 1 spray each nostril BID,  singulair 10mg  po qhs.  Patient denied personal or family history of ENT cancer.  OTC antihistamine of choice claritin 10mg  po daily.  Avoid triggers if possible.  Shower prior to bedtime if exposed to triggers.  If allergic dust/dust mites recommend mattress/pillow covers/encasements; washing linens, vacuuming, sweeping, dusting weekly.  Call or return to clinic as needed if these symptoms worsen or fail to improve as anticipated.   Exitcare handout on allergic rhinitis and sinus rinse.  Patient verbalized understanding of instructions, agreed with plan of care and had no further questions at this time.  P2:  Avoidance and hand washing.

## 2022-04-03 ENCOUNTER — Telehealth: Payer: Self-pay | Admitting: Registered Nurse

## 2022-04-03 ENCOUNTER — Encounter: Payer: Self-pay | Admitting: Registered Nurse

## 2022-04-03 DIAGNOSIS — M7651 Patellar tendinitis, right knee: Secondary | ICD-10-CM

## 2022-04-03 MED ORDER — MELOXICAM 15 MG PO TABS: 15.0000 mg | ORAL_TABLET | Freq: Every day | ORAL | 1 refills | Status: DC

## 2022-04-03 NOTE — Telephone Encounter (Signed)
Chronic knee pain and now ankle pain.  Has seen [facility] for knee and [facility].  Mobic 15mg  po daily working well for patient and ran out.  Notified to ensure not taking any other NSAIDS e.g. motrin/ibuprofen/advil/aleve/naproxen/naprosyn/diclofenac/voltaren while on mobic.  Electronic Rx to her pharmacy of choice #30 RF1.  Patient verbalized understanding information/instructions, agreed with plan of care and had no further questions at this time. ?

## 2022-04-16 NOTE — Telephone Encounter (Signed)
Patient seen in [facility]center yesterday pushing/pulling cart of product.  Feeling well denied concerns.  Stated knees have been feeling better but still having discomfort.  A&Ox3 spoke full sentences without difficulty.  No audible cough/wheeze/shortness of breath. ?

## 2022-05-06 ENCOUNTER — Ambulatory Visit: Payer: Self-pay | Admitting: *Deleted

## 2022-05-06 ENCOUNTER — Other Ambulatory Visit: Payer: Self-pay | Admitting: Registered Nurse

## 2022-05-06 VITALS — BP 118/84 | Ht 66.0 in | Wt 319.0 lb

## 2022-05-06 DIAGNOSIS — E559 Vitamin D deficiency, unspecified: Secondary | ICD-10-CM

## 2022-05-06 DIAGNOSIS — Z0000 Encounter for general adult medical examination without abnormal findings: Secondary | ICD-10-CM

## 2022-05-06 NOTE — Progress Notes (Signed)
[employer] insurance premium discount evaluation: ?Labs Drawn by [facility] onsite ?Replacements ROI form signed. ?Tobacco Free Attestation form signed.  ?Forms placed in paper chart. ? ?

## 2022-05-07 LAB — HGB A1C W/O EAG: Hgb A1c MFr Bld: 5.6 % (ref 4.8–5.6)

## 2022-05-07 LAB — CMP12+LP+TP+TSH+6AC+CBC/D/PLT
ALT: 12 IU/L (ref 0–32)
AST: 16 IU/L (ref 0–40)
Albumin/Globulin Ratio: 1.3 (ref 1.2–2.2)
Albumin: 4.2 g/dL (ref 3.8–4.8)
Alkaline Phosphatase: 68 IU/L (ref 44–121)
BUN/Creatinine Ratio: 17 (ref 9–23)
BUN: 13 mg/dL (ref 6–24)
Basophils Absolute: 0.1 10*3/uL (ref 0.0–0.2)
Basos: 1 %
Bilirubin Total: 0.3 mg/dL (ref 0.0–1.2)
Calcium: 9.5 mg/dL (ref 8.7–10.2)
Chloride: 103 mmol/L (ref 96–106)
Chol/HDL Ratio: 3.1 ratio (ref 0.0–4.4)
Cholesterol, Total: 210 mg/dL — ABNORMAL HIGH (ref 100–199)
Creatinine, Ser: 0.75 mg/dL (ref 0.57–1.00)
EOS (ABSOLUTE): 0.1 10*3/uL (ref 0.0–0.4)
Eos: 1 %
Estimated CHD Risk: <0.5 times avg. (ref 0.0–1.0)
Free Thyroxine Index: 2.3 (ref 1.2–4.9)
GGT: 15 IU/L (ref 0–60)
Globulin, Total: 3.2 g/dL (ref 1.5–4.5)
Glucose: 89 mg/dL (ref 70–99)
HDL: 68 mg/dL (ref >39)
Hematocrit: 39.9 % (ref 34.0–46.6)
Hemoglobin: 12.7 g/dL (ref 11.1–15.9)
Immature Grans (Abs): 0 10*3/uL (ref 0.0–0.1)
Immature Granulocytes: 0 %
Iron: 48 ug/dL (ref 27–159)
LDH: 220 IU/L (ref 119–226)
LDL Chol Calc (NIH): 132 mg/dL — ABNORMAL HIGH (ref 0–99)
Lymphocytes Absolute: 2.1 10*3/uL (ref 0.7–3.1)
Lymphs: 32 %
MCH: 29 pg (ref 26.6–33.0)
MCHC: 31.8 g/dL (ref 31.5–35.7)
MCV: 91 fL (ref 79–97)
Monocytes Absolute: 0.4 10*3/uL (ref 0.1–0.9)
Monocytes: 7 %
Neutrophils Absolute: 3.8 10*3/uL (ref 1.4–7.0)
Neutrophils: 59 %
Phosphorus: 3.4 mg/dL (ref 3.0–4.3)
Platelets: 388 10*3/uL (ref 150–450)
Potassium: 4.9 mmol/L (ref 3.5–5.2)
RBC: 4.38 x10E6/uL (ref 3.77–5.28)
RDW: 13 % (ref 11.7–15.4)
Sodium: 140 mmol/L (ref 134–144)
T3 Uptake Ratio: 30 % (ref 24–39)
T4, Total: 7.8 ug/dL (ref 4.5–12.0)
TSH: 1.28 u[IU]/mL (ref 0.450–4.500)
Total Protein: 7.4 g/dL (ref 6.0–8.5)
Triglycerides: 57 mg/dL (ref 0–149)
Uric Acid: 4.7 mg/dL (ref 2.6–6.2)
VLDL Cholesterol Cal: 10 mg/dL (ref 5–40)
WBC: 6.4 10*3/uL (ref 3.4–10.8)
eGFR: 100 mL/min/{1.73_m2} (ref >59)

## 2022-05-07 LAB — VITAMIN D 25 HYDROXY (VIT D DEFICIENCY, FRACTURES): Vit D, 25-Hydroxy: 34.8 ng/mL (ref 30.0–100.0)

## 2022-05-09 ENCOUNTER — Encounter: Payer: Self-pay | Admitting: Registered Nurse

## 2022-05-09 MED ORDER — CHOLECALCIFEROL 1.25 MG (50000 UT) PO TABS: 1.0000 | ORAL_TABLET | ORAL | 0 refills | Status: AC

## 2022-05-09 NOTE — Addendum Note (Signed)
Addended by: Gerarda Fraction A on: 05/09/2022 05:21 PM   Modules accepted: Orders, Level of Service

## 2022-05-12 NOTE — Progress Notes (Signed)
Results reviewed with pt in clinic per NP notes above. Lipids elevated, similar to previous. Pt congratulated on A1c improving to high normal. Start Vit D supplement. Lab appt made for Nov 2023 to recheck vit D level.  Pt sees [facility] in [place] for primary care but has only been seen for Depo Provera shot recently. Will plan to schedule annual visit with primary soon. Routine f/u with pcp. Copy of labs provided to pt. No further questions/concerns.

## 2022-05-12 NOTE — Progress Notes (Signed)
Reviewed RN Rolly Salter note agreed with plan of care and patient established with [facility] office.

## 2022-05-19 NOTE — Progress Notes (Signed)
Noted reviewed RN Haley note 

## 2022-08-20 ENCOUNTER — Telehealth: Payer: Self-pay | Admitting: Registered Nurse

## 2022-08-20 ENCOUNTER — Encounter: Payer: Self-pay | Admitting: Registered Nurse

## 2022-08-20 DIAGNOSIS — G8929 Other chronic pain: Secondary | ICD-10-CM

## 2022-08-20 NOTE — Telephone Encounter (Signed)
Knee brace fitted and distributed from clinic stock has ripped at seam lateral edge.  Patient tried to purchase another OTC but unable to find correct size available in stock.  Patient notified may come to clinic [facility] to get new knee sleeve from RN Stone before 4pm today.  Clinic closed tomorrow.  Open again Thursday 08/21/22 0830a-4p and closed Friday this week due to holiday federal weekend.  RN Stone notified to measure and dispense from clinic stock this week to patient.  Patient has seen [facility] for evaluation and treatment and completed PT session in the past year.  Patient verbalized understanding information/instructions, agreed with plan of care and had no further questions at this time.

## 2022-08-28 NOTE — Telephone Encounter (Signed)
Patient reported she was distributed and fitted new knee sleeve by RN Stone but not feeling right.  Wondering if she was dispensed left knee sleeve instead of right as velcro facing same way as her left knee sleeve.  Will check clinic stock to see if that manufacturer sleeve is universal or side specific and notify patient.  Patient verbalized understanding information and had no further questions at this time.

## 2022-09-03 NOTE — Telephone Encounter (Signed)
Patient contacted stated did not return to clinic to see RN Stone for recheck of knee brace.  Leaving on vacation now/has clocked out from work will stop in clinic next week when she returns from vacation with her brace for re-evaluation fit.

## 2022-09-12 NOTE — Telephone Encounter (Signed)
Reminded patient to see me in clinic 9/21 11-2 for brace refitting knee.  Patient stated she would follow up with me.  Returned from vacation refreshed/relaxed.

## 2022-10-04 NOTE — Telephone Encounter (Signed)
Diameter right knee central patella/fossa crease 21" 2XL right knee sleeve fitted and dispensed to patient from clinic stock 10/02/22.  Patient reported it fits much better than old one.  Gait sure and steady/skin warm dry and pink.

## 2022-10-14 ENCOUNTER — Other Ambulatory Visit: Payer: Self-pay | Admitting: Family Medicine

## 2022-10-14 DIAGNOSIS — Z1231 Encounter for screening mammogram for malignant neoplasm of breast: Secondary | ICD-10-CM

## 2022-10-18 ENCOUNTER — Ambulatory Visit
Admission: RE | Admit: 2022-10-18 | Discharge: 2022-10-18 | Disposition: A | Discharge status: Home / Self Care | Payer: No Typology Code available for payment source | Source: Ambulatory Visit | Attending: Family Medicine | Admitting: Family Medicine

## 2022-10-18 DIAGNOSIS — Z1231 Encounter for screening mammogram for malignant neoplasm of breast: Secondary | ICD-10-CM

## 2023-01-13 ENCOUNTER — Encounter: Payer: Self-pay | Admitting: Registered Nurse

## 2023-01-13 ENCOUNTER — Ambulatory Visit: Payer: No Typology Code available for payment source | Admitting: Registered Nurse

## 2023-01-13 VITALS — BP 132/80 | HR 78 | Temp 98.0°F | Resp 16

## 2023-01-13 DIAGNOSIS — G8929 Other chronic pain: Secondary | ICD-10-CM

## 2023-01-13 DIAGNOSIS — H6593 Unspecified nonsuppurative otitis media, bilateral: Secondary | ICD-10-CM

## 2023-01-13 DIAGNOSIS — J302 Other seasonal allergic rhinitis: Secondary | ICD-10-CM

## 2023-01-13 MED ORDER — LORATADINE 10 MG PO TABS: 10.0000 mg | ORAL_TABLET | Freq: Every day | ORAL | 3 refills | Status: DC

## 2023-01-13 MED ORDER — MONTELUKAST SODIUM 10 MG PO TABS: 10.0000 mg | ORAL_TABLET | Freq: Every day | ORAL | 3 refills | Status: DC

## 2023-01-13 MED ORDER — AMOXICILLIN 875 MG PO TABS: 875.0000 mg | ORAL_TABLET | Freq: Two times a day (BID) | ORAL | 0 refills | Status: AC

## 2023-01-13 MED ORDER — FLUTICASONE PROPIONATE 50 MCG/ACT NA SUSP: 1.0000 | Freq: Two times a day (BID) | NASAL | 6 refills | Status: DC

## 2023-01-13 NOTE — Progress Notes (Signed)
Subjective:    Patient ID: Shari Shields, female    DOB: 04-Aug-1976, 47 y.o.   MRN: [id number]  46y/o established african [place] female here for evaluation left ear pain.  Denied discharge/fever/chills.  Also needs refill on claritin, flonase and singulair no refills remaining on her rx at [facility].  Medication working well for her denied side effects or concerns.  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        Review of Systems  Constitutional:  Negative for chills, diaphoresis and fever.  HENT:  Positive for ear pain. Negative for congestion, ear discharge, facial swelling, hearing loss, postnasal drip, rhinorrhea, sinus pressure, sinus pain, sneezing, sore throat, trouble swallowing and voice change.   Eyes:  Negative for pain and discharge.  Respiratory:  Negative for cough, shortness of breath, wheezing and stridor.   Cardiovascular:  Negative for chest pain and leg swelling.  Genitourinary:  Negative for difficulty urinating, dysuria and hematuria.  Musculoskeletal:  Positive for arthralgias. Negative for back pain, gait problem, joint swelling, myalgias, neck pain and neck stiffness.  Skin:  Negative for rash.  Allergic/Immunologic: Negative for environmental allergies and food allergies.  Neurological:  Negative for dizziness, tremors, facial asymmetry, weakness and headaches.  Hematological:  Negative for adenopathy. Does not bruise/bleed easily.  Psychiatric/Behavioral:  Negative for agitation, confusion and sleep disturbance.        Objective:   Physical Exam Vitals and nursing note reviewed.  Constitutional:      General: She is awake. She is not in acute distress.    Appearance: Normal appearance. She is well-developed and well-groomed. She is obese. She is not ill-appearing, toxic-appearing or diaphoretic.  HENT:     Head: Normocephalic and atraumatic.     Jaw: There is normal jaw  occlusion. No trismus.     Salivary Glands: Right salivary gland is not diffusely enlarged or tender. Left salivary gland is not diffusely enlarged or tender.     Right Ear: Hearing, ear canal and external ear normal. No decreased hearing noted. Swelling present. No laceration, drainage or tenderness. A middle ear effusion is present. There is no impacted cerumen. No foreign body. No mastoid tenderness. No PE tube. No hemotympanum. Tympanic membrane is erythematous and bulging. Tympanic membrane is not injected, perforated or retracted.     Left Ear: Ear canal and external ear normal. No decreased hearing noted. Swelling present. No laceration, drainage or tenderness. A middle ear effusion is present. There is no impacted cerumen. No foreign body. No mastoid tenderness. No PE tube. No hemotympanum. Tympanic membrane is erythematous and bulging. Tympanic membrane is not injected, perforated or retracted.     Ears:     Comments: Bilateral TMs and auditory canals with erythema and swelling left greater than right; bilateral TMs intact; cobblestoning posterior pharynx; bilateral allergic shiners    Nose: Nose normal. No nasal deformity, septal deviation, laceration, congestion or rhinorrhea.     Right Turbinates: Not enlarged, swollen or pale.     Left Turbinates: Not enlarged, swollen or pale.     Right Sinus: No maxillary sinus tenderness or frontal sinus tenderness.     Left Sinus: No maxillary sinus tenderness or frontal sinus tenderness.     Mouth/Throat:     Lips: Pink. No lesions.     Mouth: Mucous membranes are moist. Mucous membranes are not pale, not dry and not cyanotic. No lacerations, oral lesions or  angioedema.     Dentition: No dental abscesses or gum lesions.     Pharynx: Uvula midline. Pharyngeal swelling and posterior oropharyngeal erythema present. No oropharyngeal exudate or uvula swelling.     Tonsils: No tonsillar exudate or tonsillar abscesses.  Eyes:     General: Lids are  normal. Vision grossly intact. Gaze aligned appropriately. Allergic shiner present. No scleral icterus.       Right eye: No foreign body, discharge or hordeolum.        Left eye: No foreign body, discharge or hordeolum.     Extraocular Movements: Extraocular movements intact.     Right eye: Normal extraocular motion and no nystagmus.     Left eye: Normal extraocular motion and no nystagmus.     Conjunctiva/sclera: Conjunctivae normal.     Right eye: Right conjunctiva is not injected. No chemosis, exudate or hemorrhage.    Left eye: Left conjunctiva is not injected. No chemosis, exudate or hemorrhage.    Pupils: Pupils are equal, round, and reactive to light. Pupils are equal.     Right eye: Pupil is round and reactive.     Left eye: Pupil is round and reactive.  Neck:     Thyroid: No thyroid mass or thyromegaly.     Trachea: Trachea and phonation normal. No tracheal tenderness or tracheal deviation.  Cardiovascular:     Rate and Rhythm: Normal rate and regular rhythm.     Pulses: Normal pulses.          Radial pulses are 2+ on the right side and 2+ on the left side.     Heart sounds: Normal heart sounds, S1 normal and S2 normal.  Pulmonary:     Effort: Pulmonary effort is normal. No accessory muscle usage or respiratory distress.     Breath sounds: Normal breath sounds and air entry. No stridor or transmitted upper airway sounds. No decreased breath sounds, wheezing, rhonchi or rales.     Comments: Spoke full sentences without difficulty; no cough observed in exam room Chest:     Chest wall: No tenderness.  Abdominal:     General: Abdomen is flat. There is no distension.     Palpations: Abdomen is soft.  Musculoskeletal:        General: No tenderness. Normal range of motion.     Right hand: Normal strength. Normal capillary refill.     Left hand: Normal strength. Normal capillary refill.     Cervical back: Normal range of motion and neck supple. No swelling, edema, deformity,  erythema, signs of trauma, lacerations, rigidity, torticollis, tenderness or crepitus. No pain with movement or muscular tenderness. Normal range of motion.     Right knee: No swelling.     Left knee: No swelling.     Comments: Patient wearing knee brace right  Lymphadenopathy:     Head:     Right side of head: No submental, submandibular, tonsillar, preauricular, posterior auricular or occipital adenopathy.     Left side of head: No submental, submandibular, tonsillar, preauricular, posterior auricular or occipital adenopathy.     Cervical: No cervical adenopathy.     Right cervical: No superficial, deep or posterior cervical adenopathy.    Left cervical: No superficial, deep or posterior cervical adenopathy.  Skin:    General: Skin is warm and dry.     Capillary Refill: Capillary refill takes less than 2 seconds.     Coloration: Skin is not ashen, cyanotic, jaundiced, mottled, pale or sallow.  Findings: No abrasion, abscess, acne, bruising, burn, ecchymosis, erythema, signs of injury, laceration, lesion, petechiae, rash or wound.     Nails: There is no clubbing.  Neurological:     General: No focal deficit present.     Mental Status: She is alert and oriented to person, place, and time. Mental status is at baseline. She is not disoriented.     GCS: GCS eye subscore is 4. GCS verbal subscore is 5. GCS motor subscore is 6.     Cranial Nerves: Cranial nerves 2-12 are intact. No cranial nerve deficit, dysarthria or facial asymmetry.     Sensory: Sensation is intact. No sensory deficit.     Motor: Motor function is intact. No weakness, tremor, atrophy, abnormal muscle tone or seizure activity.     Coordination: Coordination is intact. Coordination normal.     Gait: Gait is intact. Gait normal.     Comments: In/out of chair and on/off exam table without difficulty; gait sure and steady in clinic; bilateral hand grasp equal 5/5  Psychiatric:        Attention and Perception: Attention and  perception normal.        Mood and Affect: Mood and affect normal.        Speech: Speech normal.        Behavior: Behavior normal. Behavior is cooperative.        Thought Content: Thought content normal.        Cognition and Memory: Cognition and memory normal.        Judgment: Judgment normal.           Assessment & Plan:   A-bilateral otitis media with effusion, seasonal allergic rhinitis, chronic pain right knee  P-Supportive treatment. Amoxicillin 875mg  po BID x 10 days #20 RF0 dispensed from [facility] to patient  Tylenol 1000mg  po QID prn pain/fever.   No evidence of invasive bacterial infection, non toxic and well hydrated.  This is most likely self limiting viral infection.  I do not see where any further testing or imaging is necessary at this time.   I will suggest supportive care, rest, good hygiene and encourage the patient to take adequate fluids.  The patient is to return to clinic or EMERGENCY ROOM if symptoms worsen or change significantly e.g. ear pain, fever, purulent discharge from ears or bleeding.  Exitcare handout on otitis media   Patient verbalized agreement and understanding of treatment plan.    Patient may use normal saline nasal spray 2 sprays each nostril q2h wa as needed. Monteleukast 10mg  po qhs #90 RF3, loratadine 10mg  po daily #90 RF3, and fluticasone nasal 41mcg 1 spray each nostril BID #1 RF6 electronic Rx sent to her pharmacy of choice.  Patient denied personal or family history of ENT cancer.  Avoid triggers if possible.  Shower prior to bedtime if exposed to triggers.  If allergic dust/dust mites recommend mattress/pillow covers/encasements; washing linens, vacuuming, sweeping, dusting weekly.  Call or return to clinic as needed if these symptoms worsen or fail to improve as anticipated. Patient verbalized understanding of instructions, agreed with plan of care and had no further questions at this time.  P2:  Avoidance and hand washing.   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  Chronic right knee pain discussed with working shipping belt to move foot (reposition) versus twisting knee.  Continue knee brace wear when at work for extra  support.  Consider weight loss.  Follow up with her care team prn new or worsening symptoms.  Cryotherapy 15 minutes QID prn pain/swelling.  Patient verbalized understanding information/instructions, agreed with plan of care and had no further questions at this time.

## 2023-01-13 NOTE — Patient Instructions (Signed)

## 2023-01-19 ENCOUNTER — Ambulatory Visit: Payer: No Typology Code available for payment source | Admitting: Occupational Medicine

## 2023-01-19 DIAGNOSIS — H6593 Unspecified nonsuppurative otitis media, bilateral: Secondary | ICD-10-CM

## 2023-01-20 NOTE — Progress Notes (Signed)
Follow of ear infection reports improved not resoled yet. Will let me know if worsens.

## 2023-02-17 ENCOUNTER — Ambulatory Visit: Payer: No Typology Code available for payment source | Admitting: Registered Nurse

## 2023-02-17 ENCOUNTER — Encounter: Payer: Self-pay | Admitting: Registered Nurse

## 2023-02-17 VITALS — BP 130/80 | HR 87 | Temp 98.8°F | Resp 18

## 2023-02-17 DIAGNOSIS — J00 Acute nasopharyngitis [common cold]: Secondary | ICD-10-CM

## 2023-02-17 MED ORDER — SALINE SPRAY 0.65 % NA SOLN: 2.0000 | NASAL | 0 refills | Status: DC

## 2023-02-17 NOTE — Progress Notes (Unsigned)
Covid test negative

## 2023-02-17 NOTE — Patient Instructions (Signed)
Cough, Adult Coughing is a reflex that clears your throat and airways (respiratory system). It helps heal and protect your lungs. It is normal to cough from time to time. A cough that happens with other symptoms or that lasts a long time may be a sign of a condition that needs treatment. A short-term (acute) cough may only last 2-3 weeks. A long-term (chronic) cough may last 8 or more weeks. Coughing is often caused by: Diseases, such as: An infection of the respiratory system. Asthma or other heart or lung diseases. Gastroesophageal reflux. This is when acid comes back up from the stomach. Breathing in things that irritate your lungs. Allergies. Postnasal drip. This is when mucus runs down the back of your throat. Smoking. Some medicines. Follow these instructions at home: Medicines Take over-the-counter and prescription medicines only as told by your health care provider. Talk with your provider before you take cough medicine (cough suppressants). Eating and drinking Do not drink alcohol. Avoid caffeine. Drink enough fluid to keep your pee (urine) pale yellow. Lifestyle Avoid cigarette smoke. Do not use any products that contain nicotine or tobacco. These products include cigarettes, chewing tobacco, and vaping devices, such as e-cigarettes. If you need help quitting, ask your provider. Avoid things that make you cough. These may include perfumes, candles, cleaning products, or campfire smoke. General instructions  Watch for any changes to your cough. Tell your provider about them. Always cover your mouth when you cough. If the air is dry in your bedroom or home, use a cool mist vaporizer or humidifier. If your cough is worse at night, try to sleep in a semi-upright position. Rest as needed. Contact a health care provider if: You have new symptoms, or your symptoms get worse. You cough up pus. You have a fever that does not go away or a cough that does not get better after 2-3  weeks. You cannot control your cough with medicine, and you are losing sleep. You have pain that gets worse or is not helped with medicine. You lose weight for no clear reason. You have night sweats. Get help right away if: You cough up blood. You have trouble breathing. Your heart is beating very fast. These symptoms may be an emergency. Get help right away. Call 911. Do not wait to see if the symptoms will go away. Do not drive yourself to the hospital. This information is not intended to replace advice given to you by your health care provider. Make sure you discuss any questions you have with your health care provider. Document Revised: 08/08/2022 Document Reviewed: 08/08/2022 Elsevier Patient Education  [place]. Allergic Rhinitis, Adult  Allergic rhinitis is an allergic reaction that affects the mucous membrane inside the nose. The mucous membrane is the tissue that produces mucus. There are two types of allergic rhinitis: Seasonal. This type is also called hay fever and happens only during certain seasons. Perennial. This type can happen at any time of the year. Allergic rhinitis cannot be spread from person to person. This condition can be mild, bad, or very bad. It can develop at any age and may be outgrown. What are the causes? This condition is caused by allergens. These are things that can cause an allergic reaction. Allergens may differ for seasonal allergic rhinitis and perennial allergic rhinitis. Seasonal allergic rhinitis is caused by pollen. Pollen can come from grasses, trees, and weeds. Perennial allergic rhinitis may be caused by: Dust mites. Proteins in a pet's pee (urine), saliva, or dander.  Dander is dead skin cells from a pet. Smoke, mold, or car fumes. Remains of or waste from insects such as cockroaches. What increases the risk? You are more likely to develop this condition if you have a family history of allergies or other conditions related to  allergies, including: Allergic conjunctivitis. This is irritation and swelling of parts of the eyes and eyelids. Asthma. This condition affects the lungs and makes it hard to breathe. Atopic dermatitis or eczema. This is long term (chronic) irritation and swelling of the skin. Food allergies. What are the signs or symptoms? Symptoms of this condition include: Sneezing or coughing. A stuffy nose (nasal congestion), itchy nose, or nasal discharge. Itchy eyes and tearing of the eyes. A feeling of mucus dripping down the back of your throat (postnasal drip). This may cause a sore throat. Trouble sleeping. Tiredness. Headache. How is this diagnosed? This condition may be diagnosed with your symptoms, your medical history, and a physical exam. Your health care provider may check for related conditions, such as: Asthma. Pink eye. This is eye swelling and irritation caused by infection (conjunctivitis). Ear infection. Upper respiratory infection. This is an infection in the nose, throat, or upper airways. You may also have tests to find out which allergens cause your symptoms. These may include skin tests or blood tests. How is this treated? There is no cure for this condition, but treatment can help control symptoms. Treatment may include: Taking medicines that block allergy symptoms, such as corticosteroids (anti-inflammatories) and antihistamines. Medicine may be given as a shot, nasal spray, or pill. Avoiding any allergens. Being exposed again and again to tiny amounts of allergens to help you build a defense against allergens (allergenimmunotherapy). This is done if other treatments have not helped. It may include: Allergy shots. These are injected medicines that have small amounts of an allergen in them. Sublingual immunotherapy. This involves taking small doses of a medicine with an allergen in it under your tongue. If these treatments do not work, your provider may prescribe newer,  stronger medicines. Follow these instructions at home: Avoiding allergens Find out what you are allergic to and avoid those allergens. These are some things you can do to help avoid allergens: If you have perennial allergies: Replace carpet with wood, tile, or vinyl flooring. Carpet can trap dander and dust. Do not smoke. Do not allow smoking in your home Change your heating and air conditioning filters at least once a month. If you have seasonal allergies, take these steps during allergy season: Keep windows closed as much as possible. Plan outdoor activities when pollen counts are lowest. Check pollen counts before you plan outdoor activities When coming indoors, change clothing and shower before sitting on furniture or bedding. If you have a pet in the house that produces allergens: Keep the pet out of the bedroom. Vacuum, sweep, and dust regularly. General instructions Take over-the-counter and prescription medicines only as told by your provider. Drink enough fluid to keep your pee pale yellow. Where to find more information American Academy of Allergy, Asthma & Immunology: aaaai.org Contact a health care provider if: You have a fever. You develop a cough that does not go away. You make high-pitched whistling sounds when you breathe, most often when you breathe out (wheeze). Your symptoms slow you down or stop you from doing your normal activities each day. Get help right away if: You have shortness of breath. This symptom may be an emergency. Get help right away. Call 911. Do not wait  to see if the symptoms will go away. Do not drive yourself to the hospital. This information is not intended to replace advice given to you by your health care provider. Make sure you discuss any questions you have with your health care provider. Document Revised: 08/18/2022 Document Reviewed: 08/18/2022 Elsevier Patient Education  [place]. Nonallergic Rhinitis Nonallergic rhinitis is  inflammation of the mucous membrane inside the nose. The mucous membrane is the tissue that produces mucus. This condition is different from having allergic rhinitis, which is an allergy that affects the nose. Allergic rhinitis occurs when the body's defense system, or immune system, reacts to a substance that a person is allergic to (allergen), such as pollen, pet dander, mold, or dust. Nonallergic rhinitis has many similar symptoms, but it is not caused by allergens. Nonallergic rhinitis can be an acute or chronic problem. This means it can be short-term or long-term. What are the causes? This condition may be caused by many different things. Some common types of nonallergic rhinitis include: Infectious rhinitis. This is usually caused by an infection in the nose, throat, or upper airways (upper respiratory system). Vasomotor rhinitis. This is the most common type. It is caused by too much blood flow through your nose, and makes your nose swell. It is triggered by strong odors, cold air, stress, drinking alcohol, cigarette smoke, or changes in the weather. Occupational rhinitis. This type is caused by triggers in the workplace, such as chemicals, dust, animal dander, or air pollution. Hormonal rhinitis, in female teens and adults. This type is caused by an increase in the hormone estrogen and may happen during pregnancy, puberty, or monthly menstrual periods. Hormonal rhinitis gives you fewer symptoms when estrogen levels drop. Drug-induced rhinitis. Several types of medicines can cause this, such as medicines for high blood pressure or heart disease, aspirin, or NSAIDs. Nonallergic rhinitis with eosinophilia syndrome (NARES). This type is caused by having too much eosinophil, a type of white blood cell. Other causes include a reaction to eating hot or spicy foods. This does not usually cause long-term symptoms. In some cases, the cause of nonallergic rhinitis is not known. What increases the  risk? You are more likely to develop this condition if: You are 58-40 years of age. You are female. People who are female are twice as likely to have this condition. What are the signs or symptoms? Common symptoms of this condition include: Stuffy nose (nasal congestion). Runny nose. A feeling of mucus dripping down the back of your throat (postnasal drip). Trouble sleeping. Tiredness, or fatigue. Other symptoms include: Sneezing. Coughing. Itchy nose. Bloodshot eyes. How is this diagnosed? This type may be diagnosed based on: Your symptoms and medical history. A physical exam. Allergy testing to rule out allergic rhinitis. You may have skin tests or blood tests. Your health care provider may also take a swab of nasal discharge to look for an increased number of eosinophils. This is done to confirm a diagnosis of NARES. How is this treated? Treatment for this condition depends on the cause. No single treatment works for everyone. Work with your provider to find the best treatment for you. Treatment may include: Avoiding the things that trigger your symptoms. Medicines to relieve congestion, such as: Steroid nasal spray. There are many types. You may need to try a few to find out which one works best. Best boy medicine. This treats nasal congestion and may be given by mouth or as a nasal spray. These medicines are used only for a  short time. Medicines to relieve a runny nose. These may include antihistamine medicines or decongestant nasal sprays. Nasal irrigation. This involves using a salt-water (saline) spray or saline container called a neti pot. Nasal irrigation helps to clear away mucus and keep your nasal passages moist. Surgery to remove part of your mucous membrane. This is done in severe cases if the condition has not improved after 6-12 months of treatment. Follow these instructions at home: Medicines Take or use over-the-counter and prescription medicines only as told  by your provider. Do not stop using your medicine even if you start to feel better. Do not take NSAIDs, such as ibuprofen, or medicines that contain aspirin if they make your symptoms worse. Lifestyle Do not drink alcohol if it makes your symptoms worse. Do not use any products that contain nicotine or tobacco. These products include cigarettes, chewing tobacco, and vaping devices, such as e-cigarettes. If you need help quitting, ask your provider. Avoid secondhand smoke. General instructions Avoid triggers that make your symptoms worse. Use nasal irrigation as told by your provider. Sleep with the head of your bed raised. This may reduce nasal congestion when you sleep. Drink enough fluid to keep your pee (urine) pale yellow. Contact a health care provider if: You have a fever. Your symptoms are getting worse at home. Your symptoms do not lessen with medicine. You develop new symptoms, especially a headache or nosebleed. Get help right away if: You have difficulty breathing. This symptom may be an emergency. Get help right away. Call 911. Do not wait to see if the symptoms will go away. Do not drive yourself to the hospital. This information is not intended to replace advice given to you by your health care provider. Make sure you discuss any questions you have with your health care provider. Document Revised: 08/12/2022 Document Reviewed: 08/12/2022 Elsevier Patient Education  [place].

## 2023-02-17 NOTE — Progress Notes (Unsigned)
Subjective:    Patient ID: Shari Shields, female    DOB: [birth year], 47 y.o.   MRN: [id number]  46y/o african [place] female patient here for evaluation post nasal drip, cough at night when lying flat.  Has been taking loratadine, singulair.  Forgot to take loratadine this am.  Not using flonase or nasal saline.  Denied fever/chills/n/v/d.  Cough when productive clear mucous.  Some chest muscle soreness near left shoulder from coughing today.  Denied wheezing/shortness of breath/vomiting after coughing, fever or chills.  Has been traveling recently [place], [place].  Denied known sick contacts.  Would like left ear checked again today finished all antibiotics.      Review of Systems  Constitutional:  Negative for activity change, appetite change, chills, diaphoresis, fatigue, fever and unexpected weight change.  HENT:  Positive for congestion, postnasal drip, rhinorrhea, sinus pressure and sore throat. Negative for dental problem, drooling, ear discharge, ear pain, facial swelling, hearing loss, mouth sores, nosebleeds, sinus pain, sneezing, tinnitus, trouble swallowing and voice change.   Eyes:  Negative for photophobia, pain, discharge, redness, itching and visual disturbance.  Respiratory:  Positive for cough. Negative for choking, chest tightness, shortness of breath, wheezing and stridor.   Cardiovascular:  Positive for chest pain. Negative for palpitations and leg swelling.  Gastrointestinal:  Negative for diarrhea, nausea and vomiting.  Endocrine: Negative for cold intolerance and heat intolerance.  Genitourinary:  Negative for difficulty urinating.  Musculoskeletal:  Negative for arthralgias, back pain, gait problem, joint swelling, myalgias, neck pain and neck stiffness.  Skin:  Negative for color change, pallor, rash and wound.  Allergic/Immunologic: Positive for environmental allergies. Negative for food allergies.  Neurological:  Negative for dizziness, tremors, seizures, syncope,  facial asymmetry, speech difficulty, weakness, light-headedness, numbness and headaches.  Hematological:  Negative for adenopathy. Does not bruise/bleed easily.  Psychiatric/Behavioral:  Negative for agitation, behavioral problems, confusion and sleep disturbance.        Objective:   Physical Exam Vitals and nursing note reviewed.  Constitutional:      General: She is awake. She is not in acute distress.    Appearance: Normal appearance. She is well-developed and well-groomed. She is obese. She is not ill-appearing, toxic-appearing or diaphoretic.  HENT:     Head: Normocephalic and atraumatic.     Jaw: There is normal jaw occlusion. No trismus.     Salivary Glands: Right salivary gland is not diffusely enlarged or tender. Left salivary gland is not diffusely enlarged or tender.     Right Ear: Hearing, ear canal and external ear normal. No decreased hearing noted. No laceration, drainage, swelling or tenderness. A middle ear effusion is present. There is no impacted cerumen. No foreign body. No mastoid tenderness. No PE tube. No hemotympanum. Tympanic membrane is not injected, scarred, perforated, erythematous, retracted or bulging.     Left Ear: Hearing, ear canal and external ear normal. No decreased hearing noted. No laceration, drainage, swelling or tenderness. A middle ear effusion is present. There is no impacted cerumen. No foreign body. No mastoid tenderness. No PE tube. No hemotympanum. Tympanic membrane is not injected, scarred, perforated, erythematous, retracted or bulging.     Ears:     Comments: Bilateral TMs intact; air fluid level clear; no debris bilateral auditory canals; cobblestoning posterior pharynx; bilateral allergic shiners; clear discharge bilateral nasal turbinates edema erythema    Nose: Mucosal edema, congestion and rhinorrhea present. No nasal deformity, septal deviation or laceration. Rhinorrhea is clear.     Right  Turbinates: Enlarged and swollen. Not pale.      Left Turbinates: Enlarged and swollen. Not pale.     Right Sinus: No maxillary sinus tenderness or frontal sinus tenderness.     Left Sinus: No maxillary sinus tenderness or frontal sinus tenderness.     Mouth/Throat:     Lips: Pink. No lesions.     Mouth: Mucous membranes are moist. Mucous membranes are not pale, not dry and not cyanotic. No lacerations, oral lesions or angioedema.     Dentition: No dental abscesses or gum lesions.     Pharynx: Uvula midline. Pharyngeal swelling and posterior oropharyngeal erythema present. No oropharyngeal exudate or uvula swelling.     Tonsils: No tonsillar exudate or tonsillar abscesses. 1+ on the right. 1+ on the left.     Comments: No exudate bilateral tonsils 1+/4 mild erythema Eyes:     General: Lids are normal. Vision grossly intact. Gaze aligned appropriately. Allergic shiner present. No scleral icterus.       Right eye: No foreign body, discharge or hordeolum.        Left eye: No foreign body, discharge or hordeolum.     Extraocular Movements: Extraocular movements intact.     Right eye: Normal extraocular motion and no nystagmus.     Left eye: Normal extraocular motion and no nystagmus.     Conjunctiva/sclera: Conjunctivae normal.     Right eye: Right conjunctiva is not injected. No chemosis, exudate or hemorrhage.    Left eye: Left conjunctiva is not injected. No chemosis, exudate or hemorrhage.    Pupils: Pupils are equal, round, and reactive to light. Pupils are equal.     Right eye: Pupil is round and reactive.     Left eye: Pupil is round and reactive.  Neck:     Thyroid: No thyroid mass or thyromegaly.     Trachea: Trachea and phonation normal. No tracheal tenderness or tracheal deviation.  Cardiovascular:     Rate and Rhythm: Normal rate and regular rhythm.     Pulses: Normal pulses.          Radial pulses are 2+ on the right side and 2+ on the left side.     Heart sounds: Normal heart sounds, S1 normal and S2 normal.  Pulmonary:      Effort: Pulmonary effort is normal. No accessory muscle usage or respiratory distress.     Breath sounds: Normal breath sounds and air entry. No stridor. No decreased breath sounds, wheezing, rhonchi or rales.     Comments: Spoke full sentences without difficulty; no cough observed in exam room Chest:     Chest wall: No mass, lacerations, deformity, swelling, tenderness, crepitus or edema.       Comments: Pain with deep inspiration Abdominal:     General: There is no distension.     Palpations: Abdomen is soft.  Musculoskeletal:        General: No tenderness. Normal range of motion.     Right shoulder: No crepitus. Normal range of motion. Normal strength.     Left shoulder: No crepitus. Normal range of motion. Normal strength.     Right hand: Normal strength. Normal capillary refill.     Left hand: Normal strength. Normal capillary refill.     Cervical back: Normal range of motion and neck supple. No swelling, edema, deformity, erythema, signs of trauma, lacerations, rigidity, torticollis, tenderness or crepitus. No pain with movement. Normal range of motion.     Thoracic back: No swelling,  edema, deformity, signs of trauma, lacerations, spasms or tenderness. Normal range of motion.     Right hip: Normal.     Left hip: Normal.     Right knee: Normal.     Left knee: Normal.  Lymphadenopathy:     Head:     Right side of head: No submental, submandibular, tonsillar, preauricular, posterior auricular or occipital adenopathy.     Left side of head: No submental, submandibular, tonsillar, preauricular, posterior auricular or occipital adenopathy.     Cervical: No cervical adenopathy.     Right cervical: No superficial, deep or posterior cervical adenopathy.    Left cervical: No superficial, deep or posterior cervical adenopathy.  Skin:    General: Skin is warm and dry.     Capillary Refill: Capillary refill takes less than 2 seconds.     Coloration: Skin is not ashen, cyanotic, jaundiced,  mottled, pale or sallow.     Findings: No abrasion, abscess, acne, bruising, burn, ecchymosis, erythema, signs of injury, laceration, lesion, petechiae, rash or wound.     Nails: There is no clubbing.  Neurological:     General: No focal deficit present.     Mental Status: She is alert and oriented to person, place, and time. Mental status is at baseline. She is not disoriented.     GCS: GCS eye subscore is 4. GCS verbal subscore is 5. GCS motor subscore is 6.     Cranial Nerves: Cranial nerves 2-12 are intact. No cranial nerve deficit, dysarthria or facial asymmetry.     Sensory: No sensory deficit.     Motor: Motor function is intact. No weakness, tremor, atrophy, abnormal muscle tone or seizure activity.     Coordination: Coordination is intact. Coordination normal.     Gait: Gait is intact. Gait normal.     Comments: In/out of chair and on/off exam table without difficulty; gait sure and steady in clinic; bilateral hand grasp 5/5 equal bilaterally  Psychiatric:        Attention and Perception: Attention and perception normal.        Mood and Affect: Mood and affect normal.        Speech: Speech normal.        Behavior: Behavior normal. Behavior is cooperative.        Thought Content: Thought content normal.        Cognition and Memory: Cognition and memory normal.        Judgment: Judgment normal.     [employer] [place] government covid test results negative prior to patient returning to workcenter.      Assessment & Plan:  A-acute rhinitis  P-Restart flonase nasal 35mg 1 spray each nostril BID OTC.  Didn't take loratadine this am given from clinic stock take '10mg'$  po daily as allergy season has started.  Nasal saline 2 sprays each nostril q2h wa if congested/thick mucous. Given bottle nasal saline from clinic stock.  Given home Covid test to perform prior to return to workcenter as history of travel and locally covid cases increasing previous 2 weeks per state reporting discussed with  patient. Has been traveling [place], [place], home allergy trees/bulbs blooming now pollen up. Avoid triggers if possible.  Shower prior to bedtime if exposed to triggers.  If allergic dust/dust mites recommend mattress/pillow covers/encasements; washing linens, vacuuming, sweeping, dusting weekly.  Call or return to clinic as needed if these symptoms worsen or fail to improve as anticipated.   Exitcare handout on nonallergic rhinitis, allergic rhinitis.  Patient verbalized understanding of instructions, agreed with plan of care and had no further questions at this time.  P2:  Avoidance and hand washing.

## 2023-02-18 MED ORDER — BENZONATATE 200 MG PO CAPS: 200.0000 mg | ORAL_CAPSULE | Freq: Three times a day (TID) | ORAL | 0 refills | Status: AC | PRN

## 2023-03-19 ENCOUNTER — Ambulatory Visit: Payer: No Typology Code available for payment source | Admitting: Registered Nurse

## 2023-03-19 ENCOUNTER — Encounter: Payer: Self-pay | Admitting: Registered Nurse

## 2023-03-19 VITALS — BP 110/78 | HR 89 | Temp 98.2°F | Resp 16

## 2023-03-19 MED ORDER — BIOFREEZE 4 % EX GEL: 1.0000 | Freq: Four times a day (QID) | CUTANEOUS | 0 refills | Status: AC | PRN

## 2023-03-19 MED ORDER — ACETAMINOPHEN 500 MG PO TABS: 1000.0000 mg | ORAL_TABLET | Freq: Four times a day (QID) | ORAL | 0 refills | Status: AC | PRN

## 2023-03-19 NOTE — Progress Notes (Signed)
Subjective:    Patient ID: Shari Shields, female    DOB: 1976-08-29, 47 y.o.   MRN: [id number]  46y/o african [place] female established patient here for evaluation left anterior chest and side pain.  Woke up this am.  Unsure if heartburn related.  Denied known injury/illness yesterday at work/home.  Denied changes in exercises/hobbies/work duties, sweats/chills/fevers/n/v/d/rash/bruises/swelling/dyspnea with exertion/wheezing/dyspnea/dysphagia/dysphasia.  Pain worsens with certain left arm movements 4/10 in clinic.  Took aleve this am seemed to help.      Review of Systems  Constitutional:  Negative for activity change, appetite change, chills, diaphoresis, fatigue and fever.  HENT:  Negative for trouble swallowing and voice change.   Eyes:  Negative for photophobia and visual disturbance.  Respiratory:  Negative for cough, choking, chest tightness, shortness of breath, wheezing and stridor.   Cardiovascular:  Positive for chest pain. Negative for palpitations and leg swelling.  Gastrointestinal:  Negative for abdominal distention, abdominal pain, blood in stool, constipation, diarrhea, nausea and vomiting.  Endocrine: Negative for cold intolerance and heat intolerance.  Genitourinary:  Negative for difficulty urinating.  Musculoskeletal:  Positive for myalgias. Negative for back pain, gait problem, neck pain and neck stiffness.  Skin:  Negative for color change, rash and wound.  Neurological:  Negative for dizziness, tremors, seizures, syncope, facial asymmetry, speech difficulty, weakness, light-headedness, numbness and headaches.  Hematological:  Negative for adenopathy. Does not bruise/bleed easily.  Psychiatric/Behavioral:  Negative for agitation, confusion and sleep disturbance.        Objective:   Physical Exam Vitals and nursing note reviewed.  Constitutional:      General: She is awake. She is not in acute distress.    Appearance: Normal appearance. She is  well-developed and well-groomed. She is obese. She is not ill-appearing, toxic-appearing or diaphoretic.  HENT:     Head: Normocephalic and atraumatic.     Jaw: There is normal jaw occlusion. No swelling or pain on movement.     Salivary Glands: Right salivary gland is not diffusely enlarged or tender. Left salivary gland is not diffusely enlarged or tender.     Right Ear: Hearing and external ear normal.     Left Ear: Hearing and external ear normal.     Nose: Nose normal. No congestion or rhinorrhea.     Mouth/Throat:     Lips: Pink. No lesions.     Mouth: Mucous membranes are moist.     Dentition: No gum lesions.     Tongue: No lesions. Tongue does not deviate from midline.     Palate: No mass and lesions.     Pharynx: Oropharynx is clear. Uvula midline. No uvula swelling.  Eyes:     General: Lids are normal. Vision grossly intact. Gaze aligned appropriately. No allergic shiner or scleral icterus.       Right eye: No discharge.        Left eye: No discharge.     Extraocular Movements: Extraocular movements intact.     Conjunctiva/sclera: Conjunctivae normal.     Pupils: Pupils are equal, round, and reactive to light.  Neck:     Trachea: Trachea and phonation normal. No tracheal deviation.  Cardiovascular:     Rate and Rhythm: Normal rate and regular rhythm.     Pulses: Normal pulses.          Radial pulses are 2+ on the right side and 2+ on the left side.     Heart sounds: Normal heart sounds, S1 normal and S2 normal.  No murmur heard.    No friction rub. No gallop.  Pulmonary:     Effort: Pulmonary effort is normal. No accessory muscle usage, respiratory distress or retractions.     Breath sounds: Normal breath sounds and air entry. No stridor, decreased air movement or transmitted upper airway sounds. No decreased breath sounds, wheezing, rhonchi or rales.     Comments: Spoke full sentences without difficulty; no cough observed in exam room Chest:     Chest wall: Tenderness  present. No mass, lacerations, deformity, swelling, crepitus or edema.  Breasts:    Left: No swelling, bleeding or skin change.       Comments: TTP not exquisitely left pectoral central to lateral and latissimus dorsi mid axillary line left; worsens with atchley scratch/neers/liftoff shoulder and cross body reach left tests; negative pain with empty beer can/internal/external shoulder rotation Abdominal:     General: There is no distension.     Palpations: Abdomen is soft.     Tenderness: There is no guarding.  Musculoskeletal:        General: Tenderness present. No swelling or deformity.     Left shoulder: Tenderness present. No swelling, deformity, effusion, laceration, bony tenderness or crepitus. Decreased range of motion. Normal strength.     Left upper arm: No swelling, edema, deformity, lacerations, tenderness or bony tenderness.     Right elbow: No swelling, deformity, effusion or lacerations. Normal range of motion. No tenderness.     Left elbow: No swelling, deformity, effusion or lacerations. Normal range of motion. No tenderness.     Left forearm: No swelling, edema, deformity, lacerations, tenderness or bony tenderness.     Right hand: Normal strength. Normal capillary refill.     Left hand: Normal strength. Normal capillary refill.       Arms:     Cervical back: Normal range of motion and neck supple. No swelling, edema, deformity, erythema, signs of trauma, lacerations, rigidity, spasms, torticollis, tenderness, bony tenderness or crepitus. No pain with movement or muscular tenderness. Normal range of motion.     Thoracic back: No swelling, edema, deformity, signs of trauma, lacerations, spasms or tenderness. Normal range of motion.     Lumbar back: No swelling, edema, deformity, signs of trauma, lacerations, spasms or tenderness. Normal range of motion.  Lymphadenopathy:     Head:     Right side of head: No submandibular or preauricular adenopathy.     Left side of head: No  submandibular or preauricular adenopathy.     Cervical: No cervical adenopathy.     Right cervical: No superficial, deep or posterior cervical adenopathy.    Left cervical: No superficial, deep or posterior cervical adenopathy.     Upper Body:     Left upper body: No supraclavicular, axillary or pectoral adenopathy.  Skin:    General: Skin is warm and dry.     Capillary Refill: Capillary refill takes less than 2 seconds.     Coloration: Skin is not ashen, cyanotic, jaundiced, mottled, pale or sallow.     Findings: No abrasion, abscess, acne, bruising, burn, ecchymosis, erythema, signs of injury, laceration, lesion, petechiae, rash or wound.     Nails: There is no clubbing.  Neurological:     General: No focal deficit present.     Mental Status: She is alert and oriented to person, place, and time. Mental status is at baseline.     GCS: GCS eye subscore is 4. GCS verbal subscore is 5. GCS motor subscore is 6.  Cranial Nerves: No cranial nerve deficit, dysarthria or facial asymmetry.     Sensory: Sensation is intact.     Motor: Motor function is intact. No weakness, tremor, atrophy, abnormal muscle tone or seizure activity.     Coordination: Coordination is intact. Coordination normal.     Gait: Gait is intact. Gait normal.     Comments: In/out of chair without difficulty; gait sure and steady in clinic; bilateral hand grasp equal 5/5  Psychiatric:        Attention and Perception: Attention and perception normal.        Mood and Affect: Mood and affect normal.        Speech: Speech normal.        Behavior: Behavior normal. Behavior is cooperative.        Thought Content: Thought content normal.        Cognition and Memory: Cognition and memory normal.        Judgment: Judgment normal.           Assessment & Plan:   A-pectoralis strain initial visit; latisimuss dorsi strain initial visit, preventive health care  P-Exitcare handouts on rehab exercises printed and given to  patient.  Discussed if worsening chest pain with radiation to jaw/left arm/sweating/dyspnea/"feels like elephant on your chest", worsening indigestion not improving with usual OTC treatments to seek re-evaluation with provider who has EKG machine.  This clinic does not have EKG/imaging onsite.  VSS pain reproducible with orthopedic manuevers.  Safety Officer Rachel Bo notified patient diagnosed with pectoralis and latissimus dorsi muscle strain today in clinic but no known injury at work yesterday.  Patient lifts boxes at work throughout shift.  Follow up prn if new or worsening symptoms or not resolving as expected.  If work restrictions needed will need to see [facility] or [facility] as unable to write work restrictions in this clinic due to contract limitations.  Trial thermacare patch pectoralis, biofreeze gel topical QID prn pain.  May continue aleve 220mg  po BID prn pain with food.  Avoid other nsaids e.g. ibuprofen/motrin/advil/naproxen/naprosyn/diclofenac/voltaren/mobic/meloxicam while taking aleve. May take tylenol 1000mg  po q6h prn pain with aleve also.  Given 4 UD from clinic stock. Allow muscles to rest after work/days off this weekend for holiday.  Gentle arom.  If bruising/swelling/rash follow up for re-evaluation with RN Evlyn Kanner or myself also.  Patient agreed with plan of care and had no further questions at this time.  [facility] Well 2025 labs executive panel and Hgba1c ordered and patient scheduled appt for lab draw with RN [facility] April 2024.

## 2023-03-19 NOTE — Patient Instructions (Addendum)
Pectoralis Major Tear Rehab Ask your health care provider which exercises are safe for you. Do exercises exactly as told by your health care provider and adjust them as directed. It is normal to feel mild stretching, pulling, tightness, or discomfort as you do these exercises. Stop right away if you feel sudden pain or your pain gets worse. Do not begin these exercises until told by your health care provider. Stretching and range-of-motion exercises These exercises warm up your muscles and joints and improve the movement and flexibility of your shoulder. These exercises can also help to relieve pain, numbness, and tingling. Pendulum In this shoulder exercise, you let the injured arm dangle toward the floor and then swing it back and forth like a clock pendulum. Stand near a wall or a surface that you can hold onto for balance. Bend at the waist and let your left / right arm hang straight down. Use your other arm to keep your balance. Relax your arm and shoulder muscles, and move your hips and your trunk so your left / right arm swings freely. Your arm should swing because of the motion of your body, not because you are using your arm or shoulder muscles. Keep moving your hips and trunk so your arm swings in the following directions, as told by your health care provider: Side to side. Forward and backward. In clockwise and counterclockwise circles. Slowly return to the starting position. Repeat ___3_______ times. Complete this exercise _____2_____ times a day. Standing shoulder abduction, passive In this exercise, the injured shoulder relaxes (passive) while you use the healthy arm to push it away from your body (abduction). Stand and hold a broomstick, a cane, or a similar object. Place your hands a little more than shoulder width apart on the object. Your left / right hand should be palm-up, and your other hand should be palm-down. While keeping your elbow straight and your shoulder muscles  relaxed, push the stick across your body toward your left / right side. Raise your left / right arm to the side of your body and then over your head until you feel a stretch in your shoulder. Stop when you reach the angle that is recommended by your health care provider. Avoid shrugging your shoulder while you raise your arm. Keep your shoulder blade tucked down toward the middle of your spine. Hold for _____30_____ seconds. Slowly return to the starting position. Repeat _____3_____ times. Complete this exercise ______2____ times a day. Supine wand shoulder flexion, passive In this exercise, the injured shoulder relaxes (passive) while you use the healthy arm to move it (flexion). Lie on your back (supine position). You may bend your knees for comfort. Hold a broomstick, a cane, or a similar object across your hips. Your hands should be shoulder width apart on the object, and your palms should face toward your feet. Raise your left / right arm in front of your face, then behind your head (toward the floor). Use your other hand to help you do this. Stop when you feel a gentle stretch in your shoulder, or when you reach the angle that is recommended by your health care provider. Hold for ______30____ seconds. Use the stick and your other arm to help you return your left / right arm to the starting position. Repeat ____3______ times. Complete this exercise ____2______ times a day. Wand shoulder external rotation, passive In this exercise, the injured shoulder relaxes (passive) while you use the healthy arm to push it away to your side (external  rotation). Stand and hold a broomstick, a cane, or a similar object so your hands are about shoulder width apart on the object. Start with your arms hanging down, then bend both elbows to a 90-degree angle (right angle). Keep your left / right elbow at your side. Use your other hand to push the stick so your left / right forearm moves away from your body, out  to your side. Keep your left / right elbow bent to a right angle and keep it against your side. Stop when you feel a gentle stretch in your shoulder, or when you reach the angle recommended by your health care provider. Hold for _____30_____ seconds. Use the stick to help you return your left / right arm to the starting position. Repeat ____3______ times. Complete this exercise ____2______ times a day. Strengthening exercises These exercises build strength and endurance in your shoulder. Endurance is the ability to use your muscles for a long time, even after your muscles get tired. Scapular protraction, standing This exercise is also called wall push-ups. Stand so you are facing a wall. Place your feet about one arm's length away from the wall. Place your hands on the wall and straighten your elbows. Keep your hands on the wall as you push your upper back away from the wall. You should feel your shoulder blades (scapulae) sliding forward (protraction) around your rib cage. Keep your elbows and your head still. If you are not sure that you are doing this exercise correctly, ask your health care provider for more instructions. Hold for ____30______ seconds. Slowly return to the starting position. Let your muscles relax completely before you repeat this exercise. Repeat ____3______ times. Complete this exercise _____2_____ times a day. Scapular retraction, seated This exercise is also called shoulder blade squeezes. Sit with good posture in a stable chair without armrests. Do not let your back touch the back of the chair. Your arms should be at your sides with your elbows bent. You may rest your forearms on a pillow if that is more comfortable. Squeeze your shoulder blades (scapulae) together. Bring them down and back (retraction). Keep your shoulders level. Do not lift your shoulders up toward your ears. Hold for ____30______ seconds. Return to the starting position. Repeat ____3 times twice  a day Chest Wall Pain Chest wall pain is pain in or around the bones and muscles of your chest. Chest wall pain may be caused by: An injury. Coughing a lot. Using your chest and arm muscles too much. Sometimes, the cause may not be known. This pain may take a few weeks or longer to get better. Follow these instructions at home: Managing pain, stiffness, and swelling If told, put ice on the painful area: Put ice in a plastic bag. Place a towel between your skin and the bag. Leave the ice on for 20 minutes, 2-3 times a day.  Activity Rest as told by your doctor. Avoid doing things that cause pain. This includes lifting heavy items. Ask your doctor what activities are safe for you. General instructions  Take over-the-counter and prescription medicines only as told by your doctor. Do not use any products that contain nicotine or tobacco, such as cigarettes, e-cigarettes, and chewing tobacco. If you need help quitting, ask your doctor. Keep all follow-up visits as told by your doctor. This is important. Contact a doctor if: You have a fever. Your chest pain gets worse. You have new symptoms. Get help right away if: You feel sick to your stomach (  nauseous) or you throw up (vomit). You feel sweaty or light-headed. You have a cough with mucus from your lungs (sputum) or you cough up blood. You are short of breath. These symptoms may be an emergency. Do not wait to see if the symptoms will go away. Get medical help right away. Call your local emergency services (911 in the [place] Do not drive yourself to the hospital. Summary Chest wall pain is pain in or around the bones and muscles of your chest. It may be treated with ice, rest, and medicines. Your condition may also get better if you avoid doing things that cause pain. Contact a doctor if you have a fever, chest pain that gets worse, or new symptoms. Get help right away if you feel light-headed or you get short of breath. These  symptoms may be an emergency. This information is not intended to replace advice given to you by your health care provider. Make sure you discuss any questions you have with your health care provider. Document Revised: 03/12/2021 Document Reviewed: 02/22/2021 Elsevier Patient Education  [place].  Muscle Strain A muscle strain is an injury that occurs when a muscle is stretched beyond its normal length. Usually, a small number of muscle fibers are torn when this happens. There are three types of muscle strains. First-degree strains have the least amount of muscle fiber tearing and the least amount of pain. Second-degree and third-degree strains have more tearing and pain. Usually, recovery from muscle strain takes 1-2 weeks. Complete healing normally takes 5-6 weeks. What are the causes? This condition is caused when a sudden, violent force is placed on a muscle and stretches it too far. This may occur with a fall, while lifting, or during sports. What increases the risk? This condition is more likely to develop in athletes and people who are physically active. What are the signs or symptoms? Symptoms of this condition include: Pain. Tenderness. Bruising. Swelling. Trouble using the muscle. How is this diagnosed? This condition is diagnosed based on a physical exam and your medical history. Tests may also be done, including an X-ray, ultrasound, or MRI. How is this treated? This condition is initially treated with PRICE therapy. This therapy involves: Protecting the muscle from being injured again. Resting the injured muscle. Icing the injured muscle. Applying pressure (compression) to the injured muscle. This may be done with a splint or elastic bandage. Raising (elevating) the injured muscle. Your health care provider may also recommend medicine for pain. Follow these instructions at home: If you have a removable splint: Wear the splint as told by your health care provider.  Remove it only as told by your health care provider. Check the skin around the splint every day. Tell your health care provider about any concerns. Loosen the splint if your fingers or toes tingle, become numb, or turn cold and blue. Keep the splint clean. If the splint is not waterproof: Do not let it get wet. Cover it with a watertight covering when you take a bath or a shower. Managing pain, stiffness, and swelling  If directed, put ice on the injured area. To do this: If you have a removable splint, remove it as told by your health care provider. Put ice in a plastic bag. Place a towel between your skin and the bag. Leave the ice on for 20 minutes, 2-3 times a day. Remove the ice if your skin turns bright red. This is very important. If you cannot feel pain, heat, or cold,  you have a greater risk of damage to the area. Move your fingers or toes often to reduce stiffness and swelling. Raise (elevate) the injured area above the level of your heart while you are sitting or lying down. Wear an elastic bandage as told by your health care provider. Make sure that it is not too tight. General instructions Take over-the-counter and prescription medicines only as told by your health care provider. Treatment may include muscle relaxants or medicines for pain and inflammation that are taken by mouth or applied to the skin. Restrict your activity and rest the injured muscle as told by your health care provider. Gentle movements may be allowed. If physical therapy was prescribed, do exercises as told by your health care provider. Do not put pressure on any part of the splint until it is fully hardened. This may take several hours. Do not use any products that contain nicotine or tobacco. These products include cigarettes, chewing tobacco, and vaping devices, such as e-cigarettes. If you need help quitting, ask your health care provider. Ask your health care provider when it is safe to drive if you  have a splint. Keep all follow-up visits. This is important. How is this prevented? Warm up before exercising. This helps to prevent future muscle strains. Contact a health care provider if: You have more pain or swelling in the injured area. Get help right away if: You have numbness or tingling in the injured area. You lose a lot of strength in the injured area. Summary A muscle strain is an injury that occurs when a muscle is stretched beyond its normal length. This condition is caused when a sudden, violent force is placed on a muscle and stretches it too far. This condition is initially treated with PRICE therapy, which involves protecting, resting, icing, compressing, and elevating. Gentle movements may be allowed. If physical therapy was prescribed, do exercises as told by your health care provider. This information is not intended to replace advice given to you by your health care provider. Make sure you discuss any questions you have with your health care provider. Document Revised: 02/25/2021 Document Reviewed: 02/25/2021 Elsevier Patient Education  [place] Revised: 04/22/2022 Document Reviewed: 04/22/2022 Elsevier Patient Education  [place].  Nonspecific Chest Pain, Adult Chest pain is an uncomfortable, tight, or painful feeling in the chest. The pain can feel like a crushing, aching, or squeezing pressure. A person can feel a burning or tingling sensation. Chest pain can also be felt in your back, neck, jaw, shoulder, or arm. This pain can be worse when you move, sneeze, or take a deep breath. Chest pain can be caused by a condition that is life-threatening. This must be treated right away. It can also be caused by something that is not life-threatening. If you have chest pain, it can be hard to know the difference, so it is important to get help right away to make sure that you do not have a serious condition. Some life-threatening causes of chest  pain include: Heart attack. A tear in the body's main blood vessel (aortic dissection). Inflammation around your heart (pericarditis). A problem in the lungs, such as a blood clot (pulmonary embolism) or a collapsed lung (pneumothorax). Some non life-threatening causes of chest pain include: Heartburn. Anxiety or stress. Damage to the bones, muscles, and cartilage that make up your chest wall. Pneumonia or bronchitis. Shingles infection (varicella-zoster virus). Your chest pain may come and go. It may also be constant. Your health  care provider will do tests and other studies to find the cause of your pain. Treatment will depend on the cause of your chest pain. Follow these instructions at home: Medicines Take over-the-counter and prescription medicines only as told by your health care provider. If you were prescribed an antibiotic medicine, take it as told by your health care provider. Do not stop taking the antibiotic even if you start to feel better. Activity Avoid any activities that cause chest pain. Do not lift anything that is heavier than 10 lb (4.5 kg), or the limit that you are told, until your health care provider says that it is safe. Rest as directed by your health care provider. Return to your normal activities only as told by your health care provider. Ask your health care provider what activities are safe for you. Lifestyle     Do not use any products that contain nicotine or tobacco, such as cigarettes, e-cigarettes, and chewing tobacco. If you need help quitting, ask your health care provider. Do not drink alcohol. Make healthy lifestyle changes as recommended. These may include: Getting regular exercise. Ask your health care provider to suggest some exercises that are safe for you. Eating a heart-healthy diet. This includes plenty of fresh fruits and vegetables, whole grains, low-fat (lean) protein, and low-fat dairy products. A dietitian can help you find healthy  eating options. Maintaining a healthy weight. Managing any other health conditions you may have, such as high blood pressure (hypertension) or diabetes. Reducing stress, such as with yoga or relaxation techniques. General instructions Pay attention to any changes in your symptoms. It is up to you to get the results of any tests that were done. Ask your health care provider, or the department that is doing the tests, when your results will be ready. Keep all follow-up visits as told by your health care provider. This is important. You may be asked to go for further testing if your chest pain does not go away. Contact a health care provider if: Your chest pain does not go away. You feel depressed. You have a fever. You notice changes in your symptoms or develop new symptoms. Get help right away if: Your chest pain gets worse. You have a cough that gets worse, or you cough up blood. You have severe pain in your abdomen. You faint. You have sudden, unexplained chest discomfort. You have sudden, unexplained discomfort in your arms, back, neck, or jaw. You have shortness of breath at any time. You suddenly start to sweat, or your skin gets clammy. You feel nausea or you vomit. You suddenly feel lightheaded or dizzy. You have severe weakness, or unexplained weakness or fatigue. Your heart begins to beat quickly, or it feels like it is skipping beats. These symptoms may represent a serious problem that is an emergency. Do not wait to see if the symptoms will go away. Get medical help right away. Call your local emergency services (911 in the [place] Do not drive yourself to the hospital. Summary Chest pain can be caused by a condition that is serious and requires urgent treatment. It may also be caused by something that is not life-threatening. Your health care provider may do lab tests and other studies to find the cause of your pain. Follow your health care provider's instructions on taking  medicines, making lifestyle changes, and getting emergency treatment if symptoms become worse. Keep all follow-up visits as told by your health care provider. This includes visits for any further testing if your  chest pain does not go away. This information is not intended to replace advice given to you by your health care provider. Make sure you discuss any questions you have with your health care provider. Document Revised: 10/23/2022 Document Reviewed: 10/23/2022 Elsevier Patient Education  [place].

## 2023-04-16 ENCOUNTER — Other Ambulatory Visit: Payer: No Typology Code available for payment source | Admitting: Occupational Medicine

## 2023-04-16 DIAGNOSIS — Z0000 Encounter for general adult medical examination without abnormal findings: Secondary | ICD-10-CM

## 2023-04-16 NOTE — Progress Notes (Signed)
Lab drawn tolerated well no issues noted.   

## 2023-04-17 LAB — CMP12+LP+TP+TSH+6AC+CBC/D/PLT
ALT: 12 IU/L (ref 0–32)
AST: 15 IU/L (ref 0–40)
Albumin/Globulin Ratio: 1.5 (ref 1.2–2.2)
Albumin: 4 g/dL (ref 3.9–4.9)
Alkaline Phosphatase: 54 IU/L (ref 44–121)
BUN/Creatinine Ratio: 13 (ref 9–23)
BUN: 12 mg/dL (ref 6–24)
Basophils Absolute: 0 10*3/uL (ref 0.0–0.2)
Basos: 1 %
Bilirubin Total: <0.2 mg/dL (ref 0.0–1.2)
Calcium: 9.6 mg/dL (ref 8.7–10.2)
Chloride: 106 mmol/L (ref 96–106)
Chol/HDL Ratio: 3.2 ratio (ref 0.0–4.4)
Cholesterol, Total: 186 mg/dL (ref 100–199)
Creatinine, Ser: 0.9 mg/dL (ref 0.57–1.00)
EOS (ABSOLUTE): 0.1 10*3/uL (ref 0.0–0.4)
Eos: 1 %
Estimated CHD Risk: <0.5 times avg. (ref 0.0–1.0)
Free Thyroxine Index: 2.1 (ref 1.2–4.9)
GGT: 13 IU/L (ref 0–60)
Globulin, Total: 2.7 g/dL (ref 1.5–4.5)
Glucose: 89 mg/dL (ref 70–99)
HDL: 58 mg/dL (ref >39)
Hematocrit: 38.4 % (ref 34.0–46.6)
Hemoglobin: 12.2 g/dL (ref 11.1–15.9)
Immature Grans (Abs): 0 10*3/uL (ref 0.0–0.1)
Immature Granulocytes: 0 %
Iron: 51 ug/dL (ref 27–159)
LDH: 202 IU/L (ref 119–226)
LDL Chol Calc (NIH): 118 mg/dL — ABNORMAL HIGH (ref 0–99)
Lymphocytes Absolute: 2.2 10*3/uL (ref 0.7–3.1)
Lymphs: 38 %
MCH: 28.8 pg (ref 26.6–33.0)
MCHC: 31.8 g/dL (ref 31.5–35.7)
MCV: 91 fL (ref 79–97)
Monocytes Absolute: 0.4 10*3/uL (ref 0.1–0.9)
Monocytes: 7 %
Neutrophils Absolute: 3.1 10*3/uL (ref 1.4–7.0)
Neutrophils: 53 %
Phosphorus: 4.4 mg/dL — ABNORMAL HIGH (ref 3.0–4.3)
Platelets: 341 10*3/uL (ref 150–450)
Potassium: 4.6 mmol/L (ref 3.5–5.2)
RBC: 4.24 x10E6/uL (ref 3.77–5.28)
RDW: 13.1 % (ref 11.7–15.4)
Sodium: 141 mmol/L (ref 134–144)
T3 Uptake Ratio: 30 % (ref 24–39)
T4, Total: 7.1 ug/dL (ref 4.5–12.0)
TSH: 1.81 u[IU]/mL (ref 0.450–4.500)
Total Protein: 6.7 g/dL (ref 6.0–8.5)
Triglycerides: 51 mg/dL (ref 0–149)
Uric Acid: 4 mg/dL (ref 2.6–6.2)
VLDL Cholesterol Cal: 10 mg/dL (ref 5–40)
WBC: 5.9 10*3/uL (ref 3.4–10.8)
eGFR: 80 mL/min/{1.73_m2} (ref >59)

## 2023-04-17 LAB — HEMOGLOBIN A1C
Est. average glucose Bld gHb Est-mCnc: 117 mg/dL
Hgb A1c MFr Bld: 5.7 % — ABNORMAL HIGH (ref 4.8–5.6)

## 2023-04-18 ENCOUNTER — Encounter: Payer: Self-pay | Admitting: Registered Nurse

## 2023-04-18 NOTE — Addendum Note (Signed)
Addended by: Albina Billet A on: 04/18/2023 02:28 PM   Modules accepted: Orders

## 2023-04-20 ENCOUNTER — Other Ambulatory Visit: Payer: Self-pay | Admitting: Registered Nurse

## 2023-04-20 ENCOUNTER — Encounter: Payer: Self-pay | Admitting: Registered Nurse

## 2023-04-20 DIAGNOSIS — R7989 Other specified abnormal findings of blood chemistry: Secondary | ICD-10-CM | POA: Insufficient documentation

## 2023-04-20 LAB — VITAMIN D 25 HYDROXY (VIT D DEFICIENCY, FRACTURES): Vit D, 25-Hydroxy: 28.8 ng/mL — ABNORMAL LOW (ref 30.0–100.0)

## 2023-04-20 LAB — SPECIMEN STATUS REPORT

## 2023-04-20 MED ORDER — CHOLECALCIFEROL 1.25 MG (50000 UT) PO CAPS: 50000.0000 [IU] | ORAL_CAPSULE | ORAL | 3 refills | Status: DC

## 2023-04-20 MED ORDER — CHOLECALCIFEROL 1.25 MG (50000 UT) PO CAPS: 50000.0000 [IU] | ORAL_CAPSULE | ORAL | 3 refills | Status: AC

## 2023-04-28 ENCOUNTER — Ambulatory Visit: Payer: No Typology Code available for payment source | Admitting: Occupational Medicine

## 2023-04-28 VITALS — BP 134/82 | Ht 65.0 in | Wt 313.0 lb

## 2023-04-28 DIAGNOSIS — R7989 Other specified abnormal findings of blood chemistry: Secondary | ICD-10-CM

## 2023-04-28 DIAGNOSIS — R7303 Prediabetes: Secondary | ICD-10-CM

## 2023-04-28 DIAGNOSIS — Z0000 Encounter for general adult medical examination without abnormal findings: Secondary | ICD-10-CM

## 2023-04-28 NOTE — Patient Instructions (Signed)
Hyperphosphatemia Hyperphosphatemia is when the level of phosphate in a person's blood is too high. Phosphate forms when a mineral called phosphorus binds with oxygen. Phosphate is important because it binds with calcium to form and support the teeth and bones. Phosphate is also important for muscle functioning. However, if too much phosphate builds up in your blood, it binds to calcium in your blood and causes low calcium levels (hypocalcemia). Too much phosphate binding to calcium can also cause stones (calcifications) to build up in your body. This can damage your kidneys, heart, and blood vessels. What are the causes? The most common cause of this condition is kidney failure. A diseased kidney may not filter enough phosphate out of your blood. Other causes of hyperphosphatemia include: Getting too much phosphate. This can happen from taking too many laxatives or using enemas that contain phosphate. It can also result from receiving too much phosphate in IV fluids. Increased absorption of phosphate. This can happen if you take too much vitamin D. Vitamin D causes more phosphate to be absorbed in the digestive tract. Conditions that release phosphate from inside the cells of the body (redistribution). This can happen from: Severe muscle injury, such as a crushing injury, or any condition that causes muscle cells to break down quickly. Cancer cells that die quickly. Severe infection. Poorly controlled diabetes. Low parathyroid hormone. This hormone stimulates excretion of phosphate in the kidney. If it gets too low, phosphate can start to build up in the blood. What are the signs or symptoms? In most cases, there are no symptoms of this condition. Symptoms can develop if the condition leads to hypocalcemia or calcifications, or if it occurs along with kidney failure. Signs of hypocalcemia include: Muscle cramps or spasms. Numbness and tingling around the mouth. Abnormal heart  rhythms. Seizures. Signs of calcifications include: Hard bumps (nodules) under the skin. The nodules may cause an itchy skin rash. Joint pain. Signs and symptoms of kidney failure include: Fatigue. Shortness of breath. Appetite loss. Nausea and vomiting. How is this diagnosed? This condition may be diagnosed based on a blood test to check whether you have a high level of phosphate. Your health care provider may suspect this condition based on your symptoms and medical history, especially if you have kidney disease or another condition that can cause hyperphosphatemia. The condition is sometimes found during a routine blood test. You may also have other tests, including: Blood tests for calcium, parathyroid hormone, and kidney function. Urine tests. Imaging studies to look for calcifications in skin, blood vessels, kidneys, or the heart. A test to check for physical signs of hypocalcemia. These include increased reflexes and abnormal facial muscle contractions when tapping on the face (Chvostek's sign). How is this treated? Hyperphosphatemia can be reversed if the underlying condition is found and treated. If you do not have kidney failure, the condition may improve without treatment. If you have symptoms of kidney failure, treatment may include: IV fluids and medicines that increase urine flow (diuretics) to flush out the phosphate. Filtering phosphate out of the blood with a procedure called dialysis. Medicines that bind to phosphate to pull it out of the blood. Limiting phosphorus or phosphate in the diet. Follow these instructions at home: Eating and drinking  Follow instructions from your health care provider about eating or drinking restrictions. Check food labels for phosphate or phosphorus. You may need to work with a dietitian to lower the amount of phosphorus in your diet. Foods that are lower in phosphorus include fruits,   vegetables, fresh or frozen meat, white rice, and white  bread. Foods that are higher in phosphorus include beer, chocolate, milk, cheese, liver, and oysters. Phosphorus is also used as a food additive and in many bottled drinks. Drink enough fluid to keep your urine pale yellow. General instructions Take over-the-counter and prescription medicines only as told by your health care provider. Do not take vitamin D or phosphorus supplements. Do not take laxatives or use enemas that contain phosphorus or phosphate. Keep all follow-up visits. This is important. Contact a health care provider if: You have signs of hypocalcemia. You have itchy bumps under your skin. You have joint pain. You have signs or symptoms of kidney failure. Get help right away if: You have a seizure. You have chest pain. You have difficulty breathing. Summary Hyperphosphatemia is the condition of having too much phosphate in your blood. Kidney failure is the most common cause of this condition. Most people do not have signs or symptoms of hyperphosphatemia unless it leads to another condition. Hyperphosphatemia can lead to hypocalcemia and calcifications in the body. Treatment for hyperphosphatemia may include IV fluids, filtering the blood with dialysis, medicine to bind phosphate, and limiting foods with phosphorus in them. This information is not intended to replace advice given to you by your health care provider. Make sure you discuss any questions you have with your health care provider. Document Revised: 05/21/2021 Document Reviewed: 05/21/2021 Elsevier Patient Education  2023 [employer]  

## 2023-04-28 NOTE — Progress Notes (Signed)
Be well insurance premium discount evaluation: Met    Epic reviewed by RN Bess Kinds and transcribed. Labs  Tobacco attestation signed. [facility] ROI formed signed. Forms placed in the chart.   Patient given handouts for [facility] pharmacies and discount drugs list,MyChart, Tele doc setup, Tele doc [address], [place] counseling and [employer] counseling.  What to do for infectious illness protocol. Given handout for list of medications that can be filled at [facility]. Given Clinic hours and Clinic Email.   Scheduled follow up labs and emailed dietitian link

## 2023-05-19 NOTE — Telephone Encounter (Signed)
Patient reported scheduled follow up with PCM.  Stopped Dr Reino Kent and decreased foods that are high in phosphorus.  She reported over the past 2 weeks feeling better also.  Daughter needs sugery and conflicts with her upcoming PCM appt so having to reschedule her PCM appt again.  Since feeling better not going to schedule with dietitian.  Taking her vitamin D supplement.  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  Be Well paperwork signed 04/30/23

## 2023-06-02 ENCOUNTER — Ambulatory Visit: Payer: No Typology Code available for payment source | Admitting: Registered Nurse

## 2023-06-02 ENCOUNTER — Encounter: Payer: Self-pay | Admitting: Registered Nurse

## 2023-06-02 VITALS — BP 128/80 | HR 68 | Temp 98.5°F | Resp 18

## 2023-06-02 DIAGNOSIS — S29011D Strain of muscle and tendon of front wall of thorax, subsequent encounter: Secondary | ICD-10-CM

## 2023-06-02 DIAGNOSIS — S29012D Strain of muscle and tendon of back wall of thorax, subsequent encounter: Secondary | ICD-10-CM

## 2023-06-02 NOTE — Patient Instructions (Signed)
Pectoralis Major Rehab Ask your health care provider which exercises are safe for you. Do exercises exactly as told by your health care provider and adjust them as directed. It is normal to feel mild stretching, pulling, tightness, or discomfort as you do these exercises. Stop right away if you feel sudden pain or your pain gets worse. Do not begin these exercises until told by your health care provider. Stretching and range-of-motion exercises These exercises warm up your muscles and joints and improve the movement and flexibility of your shoulder. These exercises can also help to relieve pain, numbness, and tingling. Pendulum In this shoulder exercise, you let the injured arm dangle toward the floor and then swing it back and forth like a clock pendulum. Stand near a wall or a surface that you can hold onto for balance. Bend at the waist and let your left / right arm hang straight down. Use your other arm to keep your balance. Relax your arm and shoulder muscles, and move your hips and your trunk so your left / right arm swings freely. Your arm should swing because of the motion of your body, not because you are using your arm or shoulder muscles. Keep moving your hips and trunk so your arm swings in the following directions, as told by your health care provider: Side to side. Forward and backward. In clockwise and counterclockwise circles. Slowly return to the starting position. Repeat ____3______ times. Complete this exercise ____2______ times a day. Standing shoulder abduction, passive In this exercise, the injured shoulder relaxes (passive) while you use the healthy arm to push it away from your body (abduction). Stand and hold a broomstick, a cane, or a similar object. Place your hands a little more than shoulder width apart on the object. Your left / right hand should be palm-up, and your other hand should be palm-down. While keeping your elbow straight and your shoulder muscles relaxed,  push the stick across your body toward your left / right side. Raise your left / right arm to the side of your body and then over your head until you feel a stretch in your shoulder. Stop when you reach the angle that is recommended by your health care provider. Avoid shrugging your shoulder while you raise your arm. Keep your shoulder blade tucked down toward the middle of your spine. Hold for ___30_______ seconds. Slowly return to the starting position. Repeat ____3______ times. Complete this exercise _____2_____ times a day. Supine wand shoulder flexion, passive In this exercise, the injured shoulder relaxes (passive) while you use the healthy arm to move it (flexion). Lie on your back (supine position). You may bend your knees for comfort. Hold a broomstick, a cane, or a similar object across your hips. Your hands should be shoulder width apart on the object, and your palms should face toward your feet. Raise your left / right arm in front of your face, then behind your head (toward the floor). Use your other hand to help you do this. Stop when you feel a gentle stretch in your shoulder, or when you reach the angle that is recommended by your health care provider. Hold for ____30______ seconds. Use the stick and your other arm to help you return your left / right arm to the starting position. Repeat _____3_____ times. Complete this exercise _____2_____ times a day. Wand shoulder external rotation, passive In this exercise, the injured shoulder relaxes (passive) while you use the healthy arm to push it away to your side (external rotation).   Stand and hold a broomstick, a cane, or a similar object so your hands are about shoulder width apart on the object. Start with your arms hanging down, then bend both elbows to a 90-degree angle (right angle). Keep your left / right elbow at your side. Use your other hand to push the stick so your left / right forearm moves away from your body, out to your  side. Keep your left / right elbow bent to a right angle and keep it against your side. Stop when you feel a gentle stretch in your shoulder, or when you reach the angle recommended by your health care provider. Hold for _____30_____ seconds. Use the stick to help you return your left / right arm to the starting position. Repeat ____3______ times. Complete this exercise _______2___ times a day. Strengthening exercises These exercises build strength and endurance in your shoulder. Endurance is the ability to use your muscles for a long time, even after your muscles get tired. Scapular protraction, standing This exercise is also called wall push-ups. Stand so you are facing a wall. Place your feet about one arm's length away from the wall. Place your hands on the wall and straighten your elbows. Keep your hands on the wall as you push your upper back away from the wall. You should feel your shoulder blades (scapulae) sliding forward (protraction) around your rib cage. Keep your elbows and your head still. If you are not sure that you are doing this exercise correctly, ask your health care provider for more instructions. Hold for _____30_____ seconds. Slowly return to the starting position. Let your muscles relax completely before you repeat this exercise. Repeat ____3______ times. Complete this exercise _____2_____ times a day. Scapular retraction, seated This exercise is also called shoulder blade squeezes. Sit with good posture in a stable chair without armrests. Do not let your back touch the back of the chair. Your arms should be at your sides with your elbows bent. You may rest your forearms on a pillow if that is more comfortable. Squeeze your shoulder blades (scapulae) together. Bring them down and back (retraction). Keep your shoulders level. Do not lift your shoulders up toward your ears. Hold for ______30____ seconds. Return to the starting position. Repeat ____3______ times.  Complete this exercise _____2_____ times a day. This information is not intended to replace advice given to you by your health care provider. Make sure you discuss any questions you have with your health care provider. Document Revised: 04/22/2022 Document Reviewed: 04/22/2022 Elsevier Patient Education  2024 [employer] Inc.  Pectoralis Major Tear  A pectoralis major tear is an injury in one of the muscles in the chest (pectoralis major). There are two of these muscles in the chest, one on each side. They help straighten the arms and are used when a person pushes forward.  A pectoralis major tear often separates the muscle away from bone along the breastbone, collarbone, or upper arm. This injury causes pain and weakness in the chest and shoulder. What are the causes? This condition is caused by putting too much stress on the pectoralis major muscle. This is most commonly a result of weight lifting. What increases the risk? You are more likely to develop this condition if: You are female, especially if you use anabolic steroids. You participate in: Weight lifting. Rugby or football. Gymnastics. Skiing. Wrestling. Boxing. Hockey. What are the signs or symptoms? You may hear a noise, like a pop or a snap, and feel a ripping pain when   the tear happens. Other signs and symptoms may include: Swelling. Bruising. Weakness. A bulge or an abnormal shape in the muscle (deformity). Pain and tenderness when pressing on the muscle. How is this diagnosed? This condition may be diagnosed based on: Your symptoms. Your medical history. A physical exam. Your health care provider may: Compare one side of your chest to the other. Check for weakness, bruising, and tenderness. Imaging tests to find the tear and check how severe it is. Tests may include MRI or ultrasound. How is this treated? This condition may be treated with surgery to reattach the muscle and repair the tear. This is more often used  for athletes and young, active people. You may not need surgery if you are older, or your tear is minor. Nonsurgical treatment may include: Wearing a sling for 3-6 weeks to keep your arm still (immobilization). Taking NSAIDs to help relieve pain and swelling. Doing physical therapy to improve your range of motion and strength. Follow these instructions at home: If you have a sling: Wear the sling as told by your health care provider. Remove it only as told by your health care provider. Loosen the sling if your fingers tingle, become numb, or turn cold and blue. Keep the sling clean. If the sling is not waterproof: Do not let it get wet. Cover it with a watertight covering when you take a bath or shower. Managing pain, stiffness, and swelling  If directed, put ice on the injured area. To do this: If you have a removable sling, remove it as told by your health care provider. Put ice in a plastic bag. Place a towel between your skin and the bag. Leave the ice on for 20 minutes, 2-3 times a day. If your skin turns bright red, remove the ice right away to prevent skin damage. The risk of skin damage is higher if you cannot feel pain, heat, or cold. Move your fingers often to reduce stiffness and swelling. Raise (elevate) the injured area above the level of your heart while you are sitting or lying down. Activity Ask your health care provider when it is safe to drive if you have a sling on your arm. You may have to avoid lifting. Ask your health care provider how much you can safely lift. Do exercises as told by your health care provider. Return to your normal activities as told by your health care provider. Ask your health care provider what activities are safe for you. General instructions Take over-the-counter and prescription medicines only as told by your health care provider. Do not use any products that contain nicotine or tobacco. These products include cigarettes, chewing tobacco,  and vaping devices, such as e-cigarettes. These can delay healing. If you need help quitting, ask your health care provider. Keep all follow-up visits. Your health care provider will check if your injury is healing and adjust your activities. How is this prevented? Do not use anabolic steroids. If you start a weight lifting program, make sure that you have supervision. As your fitness improves, you may add weight gradually. Warm up and stretch before being active. Cool down and stretch after being active. Give your body time to rest between activities. Use equipment that fits you. Be safe and responsible while being active. This will help you avoid falls. Maintain physical fitness, including strength, flexibility, and endurance. Contact a health care provider if: You still have pain, swelling, or weakness after 4 weeks. Exercising makes your symptoms worse. This information is not intended   to replace advice given to you by your health care provider. Make sure you discuss any questions you have with your health care provider. Document Revised: 04/11/2022 Document Reviewed: 04/11/2022 Elsevier Patient Education  2024 Elsevier Inc.  

## 2023-06-02 NOTE — Progress Notes (Signed)
Subjective:    Patient ID: Shari Shields, female    DOB: September 20, 1976, 47 y.o.   MRN: [id number]  46y/o african american female established patient here for evaluation chest and left arm pain intermittent.  Noticeably worsened yesterday.  Tried peptobismol didn't help  Denied regurgitation/heartburn symptoms.  Denied known injury/illness yesterday at work/home.  Denied changes in exercises/hobbies/work duties, sweats/chills/fevers/n/v/d/rash/bruises/swelling/dyspnea with exertion/wheezing/dyspnea/dysphagia/dysphasia.  Pain worsens with certain bilateral arm movements.  Last seen Mar 2024 for latissmus dorsi and pectoralis strain patient reported had improved/resolved.      Review of Systems  Constitutional:  Negative for chills and fever.  HENT:  Negative for trouble swallowing and voice change.   Respiratory:  Negative for cough, choking, chest tightness, shortness of breath, wheezing and stridor.   Cardiovascular:  Positive for chest pain. Negative for palpitations and leg swelling.  Gastrointestinal:  Negative for diarrhea, nausea and vomiting.  Musculoskeletal:  Positive for myalgias. Negative for arthralgias, back pain, gait problem, joint swelling, neck pain and neck stiffness.  Skin:  Negative for color change, rash and wound.  Neurological:  Negative for dizziness, tremors, syncope, facial asymmetry, speech difficulty, weakness, light-headedness, numbness and headaches.  Hematological:  Negative for adenopathy. Does not bruise/bleed easily.  Psychiatric/Behavioral:  Negative for agitation, confusion and sleep disturbance.        Objective:   Physical Exam Vitals and nursing note reviewed.  Constitutional:      General: She is awake. She is not in acute distress.    Appearance: Normal appearance. She is well-developed and well-groomed. She is obese. She is not ill-appearing, toxic-appearing or diaphoretic.  HENT:     Head: Normocephalic and atraumatic.     Jaw: There is  normal jaw occlusion.     Salivary Glands: Right salivary gland is not diffusely enlarged. Left salivary gland is not diffusely enlarged.     Right Ear: Hearing and external ear normal.     Left Ear: Hearing and external ear normal.     Nose: Nose normal. No congestion or rhinorrhea.     Mouth/Throat:     Lips: Pink. No lesions.     Mouth: Mucous membranes are moist.     Pharynx: Oropharynx is clear.  Eyes:     General: Lids are normal. Vision grossly intact. Gaze aligned appropriately. No scleral icterus.       Right eye: No discharge.        Left eye: No discharge.     Extraocular Movements: Extraocular movements intact.     Conjunctiva/sclera: Conjunctivae normal.     Pupils: Pupils are equal, round, and reactive to light.  Neck:     Trachea: Trachea and phonation normal.  Cardiovascular:     Rate and Rhythm: Normal rate and regular rhythm.     Pulses: Normal pulses.          Radial pulses are 2+ on the right side and 2+ on the left side.     Heart sounds: Normal heart sounds.  Pulmonary:     Effort: Pulmonary effort is normal. No respiratory distress.     Breath sounds: Normal breath sounds and air entry. No stridor or transmitted upper airway sounds. No wheezing, rhonchi or rales.     Comments: Spoke full sentences without difficulty; no cough observed in exam room Chest:     Chest wall: Tenderness present. No mass, lacerations, deformity, swelling, crepitus or edema.       Comments: Mildly TTP medial pectoralis right and left lattismus  dorsi Abdominal:     Palpations: Abdomen is soft.  Musculoskeletal:        General: Tenderness present. No swelling or deformity. Normal range of motion.     Right shoulder: No swelling, deformity, effusion, laceration, tenderness, bony tenderness or crepitus. Normal range of motion. Normal strength.     Left shoulder: No swelling, deformity, effusion, laceration, tenderness, bony tenderness or crepitus. Normal range of motion. Normal  strength.     Right upper arm: No swelling, edema, deformity, lacerations or tenderness.     Left upper arm: No swelling, edema, deformity, lacerations or tenderness.     Right elbow: No swelling, deformity, effusion or lacerations. Normal range of motion.     Left elbow: No swelling, deformity, effusion or lacerations. Normal range of motion.     Right forearm: No swelling, edema, deformity, lacerations or tenderness.     Left forearm: No swelling, edema, deformity, lacerations or tenderness.     Right hand: Normal strength. Normal capillary refill.     Left hand: Normal strength. Normal capillary refill.     Cervical back: Normal range of motion and neck supple. No swelling, edema, deformity, erythema, signs of trauma, lacerations, rigidity, spasms, torticollis, tenderness, bony tenderness or crepitus. No pain with movement or muscular tenderness. Normal range of motion.     Thoracic back: Tenderness present. No swelling, edema, deformity, signs of trauma, lacerations, spasms or bony tenderness. Normal range of motion. No scoliosis.       Back:     Comments: Latissmus dorsi right anterior and lateral TTP mid to central; right pectoralis  superior and medial TTP  no palpable defect; no increased temperature/rash/bruising/crepitus/fluctuance; bilateral shoulder arom equal; upper extremity strength equal 5/5; pain pectoralis and latissmus with AROM and palpation  Lymphadenopathy:     Head:     Right side of head: No submandibular or preauricular adenopathy.     Left side of head: No submandibular or preauricular adenopathy.     Cervical: No cervical adenopathy.     Right cervical: No superficial cervical adenopathy.    Left cervical: No superficial cervical adenopathy.     Upper Body:     Right upper body: No supraclavicular, axillary or pectoral adenopathy.     Left upper body: No supraclavicular, axillary or pectoral adenopathy.  Skin:    General: Skin is warm and dry.     Capillary  Refill: Capillary refill takes less than 2 seconds.     Coloration: Skin is not ashen, cyanotic, jaundiced, mottled, pale or sallow.     Findings: No abrasion, abscess, acne, bruising, burn, ecchymosis, erythema, signs of injury, laceration, lesion, petechiae, rash or wound.     Nails: There is no clubbing.  Neurological:     General: No focal deficit present.     Mental Status: She is alert and oriented to person, place, and time. Mental status is at baseline.     GCS: GCS eye subscore is 4. GCS verbal subscore is 5. GCS motor subscore is 6.     Cranial Nerves: Cranial nerves 2-12 are intact. No cranial nerve deficit, dysarthria or facial asymmetry.     Motor: Motor function is intact. No weakness, tremor, atrophy, abnormal muscle tone or seizure activity.     Coordination: Coordination is intact. Coordination normal.     Gait: Gait is intact. Gait normal.     Comments: In/out of chair and on/off exam table without difficulty; gait sure and steady in clinic; bilateral hand grasp equal  5/5  Psychiatric:        Attention and Perception: Attention and perception normal.        Mood and Affect: Mood and affect normal.        Speech: Speech normal.        Behavior: Behavior normal. Behavior is cooperative.        Thought Content: Thought content normal.        Cognition and Memory: Cognition and memory normal.        Judgment: Judgment normal.           Assessment & Plan:   A-pectoralis strain subsequent visit; latisimuss dorsi strain subsequent visit   P-[facility] handouts on pectoralis strain and rehab exercises.  Pain reproducible on palpation.  Discussed if worsening chest pain with radiation to jaw/left arm/sweating/dyspnea/"feels like elephant on your chest", worsening indigestion not improving with usual OTC treatments to seek re-evaluation with provider who has EKG machine.  This clinic does not have EKG/imaging onsite.  VSS.  Patient lifts boxes at work throughout shift.  Follow  up prn if new or worsening symptoms or not resolving as expected.  If work restrictions needed will need to see [facility] or PCM as unable to write work restrictions in this clinic due to contract limitations.  May try ice or thermacare patch pectoralis, biofreeze gel topical QID prn pain.  May continue aleve 220mg  po BID prn pain with food.  Avoid other nsaids e.g. ibuprofen/motrin/advil/naproxen/naprosyn/diclofenac/voltaren/mobic/meloxicam while taking aleve. May take tylenol 1000mg  po q6h prn pain with aleve also.  Given 4 UD from clinic stock. Allow muscles to rest after work/days.  Gentle arom.  If bruising/swelling/rash follow up for re-evaluation with RN Bess Kinds or myself also.  Patient agreed with plan of care and had no further questions at this time.

## 2023-07-15 ENCOUNTER — Other Ambulatory Visit: Payer: No Typology Code available for payment source

## 2023-07-18 ENCOUNTER — Other Ambulatory Visit: Payer: Self-pay | Admitting: Registered Nurse

## 2023-07-18 DIAGNOSIS — R7989 Other specified abnormal findings of blood chemistry: Secondary | ICD-10-CM

## 2023-07-18 DIAGNOSIS — R7303 Prediabetes: Secondary | ICD-10-CM

## 2023-07-22 ENCOUNTER — Other Ambulatory Visit: Payer: No Typology Code available for payment source | Admitting: Occupational Medicine

## 2023-07-22 DIAGNOSIS — R7303 Prediabetes: Secondary | ICD-10-CM

## 2023-07-22 NOTE — Progress Notes (Signed)
Lab drawn from Left AC tolerated well no issues noted.  Juice and walking more.

## 2023-07-23 NOTE — Progress Notes (Signed)
My chart message sent to patient.  Shari Shields,  Your 3 month blood sugar (Hgba1c) was normal.  Congratulations!  Keep up your efforts!  Sincerely,  Albina Billet NP-C

## 2023-08-12 ENCOUNTER — Encounter: Payer: Self-pay | Admitting: Registered Nurse

## 2023-08-12 DIAGNOSIS — U071 COVID-19: Secondary | ICD-10-CM

## 2023-08-12 MED ORDER — ACETAMINOPHEN 500 MG PO TABS: 1000.0000 mg | ORAL_TABLET | Freq: Four times a day (QID) | ORAL | Status: AC | PRN

## 2023-08-12 MED ORDER — PROMETHAZINE HCL 6.25 MG/5ML PO SOLN: 6.2500 mg | Freq: Four times a day (QID) | ORAL | 0 refills | Status: DC | PRN

## 2023-08-12 MED ORDER — COVID-19 ANTIGEN TEST VI KIT: 1.0000 | PACK | Freq: Every day | 1 refills | Status: AC | PRN

## 2023-08-12 MED ORDER — ALBUTEROL SULFATE HFA 108 (90 BASE) MCG/ACT IN AERS: 1.0000 | INHALATION_SPRAY | RESPIRATORY_TRACT | 0 refills | Status: AC | PRN

## 2023-08-12 MED ORDER — NIRMATRELVIR/RITONAVIR (PAXLOVID)TABLET: 3.0000 | ORAL_TABLET | Freq: Two times a day (BID) | ORAL | 0 refills | Status: AC

## 2023-08-12 NOTE — Telephone Encounter (Signed)
Patient notified NP  positive home test today sister notified her positive test yesterday so she home tested due to mild cough started Monday 08/10/23 and was with her sister all weekend.  She stated used last home test and needs Rx for more tests.   Day 0 08/12/23 positive test.  Symptoms cough started 08/10/23 evening Pt began quarantine 08/12/23.  Patient would like antivirals last took paxlovid 2023   Patient did not develop symptoms of  trouble breathing, chest pain, nausea, vomiting, diarrhea, sore throat, HA, or body aches.     5 day quarantine per [facility] recommendations. Day 1 of quarantine was 08/12/23.  Discussed Day 5 08/16/23 and estimated RTW 08/17/23.  Mask wear at work and may drink at workstation with straw under mask but not to remove mask when inside work building.  May eat at outside tables, or two rooms across from clinic with door closed or in personal vehicle while on mask wear through day 10.  Discussed patient does not have to retest prior to returning to work.  Discussed I recommended family members test if symptomatic immediately and repeat in 48 hours if negative on initial test and repeat test x2 every other day if negative on repeat. If asymptomatic on Day 6 after exposure e.g. 08/17/23 test.  Patient to contact clinic staff if vomiting after coughing or unable to tolerate po fluids. Reviewed possible Covid symptoms including cough, shortness of breath with exertion or at rest, runny nose, congestion, sinus pain/pressure, sore throat, fever/chills, body aches, fatigue, loss of taste/smell, GI symptoms of nausea/vomiting/diarrhea. Also reviewed same day/emergent eval/ER precautions of dizziness/syncope, confusion, blue tint to lips/face, severe shortness of breath/difficulty breathing/wheezing.    Patient would like antivirals is  on  prescription medications or daily medications.  Patient paxlovid emergency use handout sent to patient electronically along with covid quarantine exitcare handout  both in my chart.  Discussed how to take paxlovid e.g. 3 pills 2 of nirmatrelvir 150mg  and 1 of ritonavir 100mg  am/pm x 5 days.  Discussed lemonade can sometimes help with metallic/plastic taste in mouth (side effect medication).  Discussed most common side effects GI upset and bad taste in mouth.  Use birth control/avoid getting pregnant while on paxlovid and no breastfeeding.  Discussed I recommended not having sex with anyone while sick/testing positive/10 day quarantine as could spread virus to partner.  Discussed with patient I would call again Sunday 08/16/23 to follow up symptoms/see if questions/concerns prior to RTW 8/26 she can my chart if needs arise prior to that time.    Patient to isolate in own room and if possible use only one bathroom if living with others in home.  Wear mask when out of room to help prevent spread to others in household.  Sanitize high touch surfaces with lysol/chlorox/bleach spray or wipes daily as viruses are known to live on surfaces from 24 hours to days.  Patient has honey, dayquil/nyquil and tessalon pearles at home for cough/cold use and is not working well. May use flonase nasal 1 spray each nostril BID prn rhinitis.  Dayquil and nyquil per manufacturer instructions.  Patient does want prescription cough suppressant at this time.  Has used promethazine 6.25mg /47ml po q6h prn n/v/protracted coughing #120 RF0 Rx  and albuterol 120mcg/actuation 1-2p po q4h prn chest tightness/wheezing/protracted coughing #1 RF0 sent electronically to her pharmacy of choice.  Discussed honey 1 tablespoon every 4 hours is a natural cough suppressant.  Avoid dehydration and drink water to keep urine pale  yellow clear and voiding every 2-4 hours while awake.  Patient alert and oriented x3, spoke full sentences without difficulty.  No audible cough or throat clearing during 10 minute telephone call.  Nasal congestion audible.  Discussed with patient s/he can contact me at this number  [phone] or my chart if questions or concerns.   Pt verbalized understanding and agreement with plan of care. No further questions/concerns at this time. Pt reminded to contact clinic with any changes in symptoms or questions/concerns. [employer] team notified patient to work remote/quarantine through Day 5 RTW estimated Day 6 with strict mask wear through Day 10 and no eating in employee lunch room.  Estimated return to work onsite 08/17/23 and Day 10 08/21/23  HR notified results confirmed positive.  Supervisor Richardo Hanks notified approved absence.

## 2023-08-13 ENCOUNTER — Telehealth: Payer: Self-pay | Admitting: Registered Nurse

## 2023-08-13 DIAGNOSIS — U071 COVID-19: Secondary | ICD-10-CM

## 2023-08-13 MED ORDER — PROMETHAZINE-DM 6.25-15 MG/5ML PO SYRP: 5.0000 mL | ORAL_SOLUTION | Freq: Four times a day (QID) | ORAL | 0 refills | Status: DC | PRN

## 2023-08-16 ENCOUNTER — Encounter: Payer: Self-pay | Admitting: Registered Nurse

## 2023-08-16 NOTE — Telephone Encounter (Signed)
Patient reported finished paxlovid and picked up promethazine DM from [facility] and started prn use.  Had sinus headache today resolved with oral antihistamine, nasal saline, tylenol and flonase nasal use.  Headache free tonight.  Discussed with patient to continue nasal saline 2 sprays each nostril q2h prn congestion or thick mucous and tylenol 1000mg  po q6h prn headache.  Flonase nasal 1 spray each nostril BID and antihistamine of choice per instructions.  Discussed antihistamine can cause constipation.  Patient reported drinking water and eating normal diet.  Urine pale yellow in toilet.  Denied n/v/d/f/c.  Discussed may return to work tomorrow with mask.  Have straw under mask for drinking beverages at work this week.  Lunch to eat outside or in her car this week.  No eating in employee lunchroom.  Patient did not receive covid tests at [facility].  Discussed rx was send 08/12/23 and receipt received [id number] from [facility].  Patient stated she will contact them tomorrow and notify me if new Rx needs to be sent.  Patient A&Ox3 spoke full sentences without difficulty no audible cough/congestion/throat clearing/wheezing or shortness of breath during 6 minute call..  Discussed RN Chantel in clinic tomorrow and RN Burna Mortimer and I return on Tuesday if she needs anything.  She plans to covid retest later this week to see when negative to unmask around family.  Patient verbalized understanding information/instructions, agreed with plan of care and had no further questions at this time.

## 2023-08-18 ENCOUNTER — Encounter: Payer: Self-pay | Admitting: Registered Nurse

## 2023-08-18 ENCOUNTER — Telehealth: Payer: Self-pay | Admitting: Registered Nurse

## 2023-08-18 DIAGNOSIS — J302 Other seasonal allergic rhinitis: Secondary | ICD-10-CM | POA: Insufficient documentation

## 2023-08-18 DIAGNOSIS — U071 COVID-19: Secondary | ICD-10-CM

## 2023-08-18 MED ORDER — SALINE SPRAY 0.65 % NA SOLN: 2.0000 | NASAL | Status: DC

## 2023-08-18 MED ORDER — COVID-19 ANTIGEN TEST VI KIT: 1.0000 | PACK | Freq: Every day | 1 refills | Status: AC | PRN

## 2023-08-18 MED ORDER — MONTELUKAST SODIUM 10 MG PO TABS: 10.0000 mg | ORAL_TABLET | Freq: Every day | ORAL | 3 refills | Status: AC

## 2023-08-18 NOTE — Telephone Encounter (Signed)
Needs refill of singulair took last dose last night.  [facility] did not receive Rx for covid test either test yesterday negative but has not more tests to take second test today.  Sp02 98% RA BBS CTA seen in workcenter today HR 78 spoke full sentences without difficulty skin warm dry and pink.  No cough/throat clearing/nasal or chest congestion/wheezing/rhonchi observed/noted on exam.  Gait sure and steady wearing surgical disposable mask at work.  New covid ag test rx sent to [facility] again today use daily as needed #4 RF1 and refill for singulair 10mg  po at bedtime #90 RF3  Employer requires 2 negative tests 24 hours apart to not wear mask at work and she wants to ensure negative before not wearing mask with family members.  Patient A&Ox3.  Patient notified Rx sent and can see RN Burna Mortimer in clinic for free [place] govt home covid test today or tomorrow if not able to get her tests at [facility] again.  Patient verbalized understanding information/instructions, agreed with plan of care and had no further questions at this time.

## 2023-08-19 NOTE — Telephone Encounter (Signed)
See tcon dated 08/18/23.  Patient presented to clinic for covid free [place] got test from [employer] [facility].  Results negative supervisor and HR notified may be at work without mask as symptoms resolved and 2 negative covid tests 24 hours apart.  Patient notified may discontinue mask at work and eat in employee lunch room again as long as symptoms remain resolved when in clinic.  Patient was wearing disposable surgical mask on arrival to clinic and when seen in workcenter yesterday.  Patient agreed with plan and had no further questions at that time.

## 2023-10-01 ENCOUNTER — Encounter: Payer: Self-pay | Admitting: Registered Nurse

## 2023-10-01 ENCOUNTER — Ambulatory Visit: Payer: No Typology Code available for payment source | Admitting: Registered Nurse

## 2023-10-01 VITALS — BP 130/87 | HR 80 | Temp 99.3°F

## 2023-10-01 DIAGNOSIS — M5416 Radiculopathy, lumbar region: Secondary | ICD-10-CM

## 2023-10-01 DIAGNOSIS — G8929 Other chronic pain: Secondary | ICD-10-CM

## 2023-10-01 MED ORDER — PREDNISONE 10 MG PO TABS: ORAL_TABLET | ORAL | Status: AC

## 2023-10-01 MED ORDER — BIOFREEZE COOL THE PAIN 4 % EX GEL: 1.0000 | Freq: Four times a day (QID) | CUTANEOUS | Status: AC | PRN

## 2023-10-01 NOTE — Patient Instructions (Addendum)
Meniscus Tear, Phase I Rehab Ask your health care provider which exercises are safe for you. Do exercises exactly as told by your provider and adjust them as told. It's normal to feel mild stretching, pulling, tightness, or discomfort as you do these exercises. Stop right away if you feel sudden pain or your pain gets worse. Do not begin these exercises until told by your provider. Stretching and range-of-motion exercises These exercises warm up your muscles and joints and improve the movement and flexibility of your knee. These exercises also help to relieve pain and stiffness. Active knee flexion, supine  Lie on your back (supine position) with both knees straight. If this causes back discomfort, bend your uninjured knee so your foot is flat on the floor. Slowly slide your left / right heel toward your butt (active) until you feel a gentle stretch in the front of your knee or thigh (flexion). Stop if you have pain. Hold this position for ____30______ seconds. Slowly slide your left / right heel back to the starting position. Repeat ____3______ times. Complete this exercise ___2_______ times a day. Passive knee extension, sitting  Sit with your left / right heel propped up on a chair, a coffee table, or a footstool. Do not have anything under your knee to support it. Without using any effort (passive), allow your leg muscles to relax, letting gravity straighten out your knee (extension). Do not let your knee or leg turn inward. You should feel a stretch behind your left / right knee. If told by your provider, deepen the stretch by placing a __none________ lb / kg weight on your thigh, just above your kneecap. Hold this position for ______30____ seconds. Repeat ___3_______ times. Complete this exercise ____2______ times a day. Strengthening exercises These exercises build strength and endurance in your knee. Endurance is the ability to use your muscles for a long time, even after they get  tired. Quadriceps, isometric  Lie on your back with your left / right leg straight (extended) and your other knee bent. You may prop yourself up on your elbows. Put a rolled towel or a small pillow under your left / right knee if told by your provider. Without moving your knee joint (isometric), slowly tense the muscles in the front of your left / right thigh (quadriceps). You should see your kneecap slide up toward your hip or see increased dimpling just above the knee. This motion will push the back of your knee toward the floor. For ____30______ seconds, hold the muscle as tight as you can without increasing your pain. Relax the muscles slowly and completely. Repeat __3________ times. Complete this exercise ___2_______ times a day. Straight leg raises, supine This exercise strengthens the muscles in the front of your thigh (quadriceps). Lie on your back (supine position) with your left / right leg extended and your other knee bent. Tense the muscles in the front of your left / right thigh. You should see your kneecap slide up toward your hip or see increased dimpling just above the knee. Keep these muscles tight as you raise your leg 4-6 inches (10-15 cm) off the floor. Do not let your knee bend. Hold this position for ___30_______ seconds. Keep these muscles tense as you lower your leg. Relax the muscles slowly and completely after each repetition. Repeat ____3______ times. Complete this exercise ___2_______ times a day. Hamstring curls  On the floor or a bed, lie on your abdomen with your legs straight. Put a folded towel or a small pillow  under your left / right thigh, just above your kneecap. Slowly bend your left / right knee as far as you can without pain. Keep your hips flat against the floor or bed. Hold this position for ___30_______ seconds. Slowly lower your leg to the starting position. Repeat ___3_______ times. Complete this exercise ___2Hip Pain The hip is the joint between  the upper legs and the lower pelvis. The bones, cartilage, tendons, and muscles of your hip joint support your body and allow you to move around. Hip pain can range from a minor ache to severe pain in one or both of your hips. The pain may be felt on the inside of the hip joint near the groin, or on the outside near the buttocks and upper thigh. You may also have swelling or stiffness in your hip area. Follow these instructions at home: Managing pain, stiffness, and swelling     If told, put ice on the painful area. Put ice in a plastic bag. Place a towel between your skin and the bag. Leave the ice on for 20 minutes, 2-3 times a day. If told, apply heat to the affected area as often as told by your health care provider. Use the heat source that your provider recommends, such as a moist heat pack or a heating pad. Place a towel between your skin and the heat source. Leave the heat on for 20-30 minutes. If your skin turns bright red, remove the ice or heat right away to prevent skin damage. The risk of damage is higher if you cannot feel pain, heat, or cold. Activity Do exercises as told by your provider. Avoid activities that cause pain. General instructions  Take over-the-counter and prescription medicines only as told by your provider. Keep a journal of your symptoms. Write down: How often you have hip pain. The location of your pain. What the pain feels like. What makes the pain worse. Sleep with a pillow between your legs on your most comfortable side. Keep all follow-up visits. Your provider will monitor your pain and activity. Contact a health care provider if: You cannot put weight on your leg. Your pain or swelling gets worse after a week. It gets harder to walk. You have a fever. Get help right away if: You fall. You have a sudden increase in pain and swelling in your hip. Your hip is red or swollen or very tender to touch. This information is not intended to replace  advice given to you by your health care provider. Make sure you discuss any questions you have with your health care provider. Document Revised: 08/12/2022 Document Reviewed: 08/12/2022 Elsevier Patient Education  2024 [employer] _______ times a day. This information is not intended to replace advice given to you by your health care provider. Make sure you discuss any questions you have with your health care provider. Document Revised: 02/27/2023 Document Reviewed: 02/27/2023 Elsevier Patient Education  2024 [employer] Meniscus Tear  A meniscus tear is a knee injury that happens when a piece of the meniscus is torn. The meniscus is a thick, rubbery, wedge-shaped piece of cartilage in the knee. Each knee has two menisci sitting between the upper bone (femur) and lower bone (tibia) that form the knee joint. Each meniscus acts as a shock absorber for the knee. A torn meniscus is a common knee injury, ranging from mild to severe. Surgery may be needed to repair a severe tear. What are the causes? This condition may be caused by kneeling, squatting,  twisting, or pivoting movements. Sports-related injuries are the most common cause, often resulting from: Running and stopping suddenly. Changing direction. Being tackled or knocked off your feet. Lifting or carrying heavy weights. As people get older, their menisci get thinner and weaker. Tears can happen more easily in older people, for example, when climbing stairs. What increases the risk? You are more likely to develop this condition if you: Play contact sports. Have a job that requires kneeling or squatting. Are female. Are over 67 years old. What are the signs or symptoms? Symptoms of this condition include: Knee pain, especially at the side of the knee joint. You may feel pain immediately after injury, or hear a pop and feel pain later. A feeling that your knee is clicking, catching, locking, or giving way (weakness, instability). Not  being able to fully bend or extend your knee. Bruising or swelling in your knee. How is this diagnosed? This condition may be diagnosed based on your symptoms and a physical exam. You may also have tests, such as: X-rays. MRI. Arthroscopy. This is a procedure to look inside your knee with a narrow surgical telescope. You may be referred to a knee specialist (orthopedic surgeon). How is this treated? Treatment for this injury depends on the severity of the tear. Treatment for a mild tear may include: Rest. Medicine to reduce pain and swelling, usually a nonsteroidal anti-inflammatory drug (NSAID), like ibuprofen. A knee brace, sleeve, or wrap. Using crutches or a walker to keep weight off your knee and help with walking. Exercises to strengthen your knee (physical therapy). You may need surgery if you have a severe tear or if other treatments fail. Follow these instructions at home: If you have a brace, sleeve, or wrap: Wear it, as told by your health care provider. Remove it, only as told by your health care provider. Loosen the brace, sleeve, or wrap if your toes tingle, become numb, or turn cold and blue. Keep the brace, sleeve, or wrap clean. If the brace, sleeve, or wrap is not waterproof: Do not let it get wet. Cover it with a watertight covering when you take a bath or shower. Managing pain, stiffness, and swelling  Take over-the-counter and prescription medicines only as told by your health care provider. If directed, put ice on your knee. To do this: If you have a removable brace, sleeve, or wrap, remove it as told by your health care provider. Put ice in a plastic bag. Place a towel between your skin and the bag. Leave the ice on for 20 minutes, 2-3 times per day. Remove the ice if your skin turns bright red. This is very important. If you cannot feel pain, heat, or cold, you have a greater risk of damage to the area. Move your toes often to reduce stiffness and  swelling. Raise (elevate) the injured area above the level of your heart while you are sitting or lying down. Activity Do not use the injured limb to support your body weight until your health care provider says that you can. Use crutches or a walker as told by your health care provider. Return to your normal activities as told by your health care provider. Ask your health care provider what activities are safe for you. Perform range-of-motion exercises only as told by your health care provider. Begin doing exercises to strengthen your knee and leg muscles only as told by your health care provider. After you recover, your health care provider may recommend these exercises to help prevent  another injury. General instructions Use a knee brace, sleeve, or wrap as told by your health care provider. Ask your health care provider when it is safe to drive if you have a brace, sleeve, or wrap on your knee. Do not use any products that contain nicotine or tobacco, such as cigarettes, e-cigarettes, and chewing tobacco. If you need help quitting, ask your health care provider. Ask your health care provider if the medicine prescribed to you: Requires you to avoid driving or using heavy machinery. Can cause constipation. You may need to take these actions to prevent or treat constipation: Drink enough fluid to keep your urine pale yellow. Take over-the-counter or prescription medicines. Eat foods that are high in fiber, such as beans, whole grains, and fresh fruits and vegetables. Limit foods that are high in fat and processed sugars, such as fried or sweet foods. Keep all follow-up visits. This is important. Contact a health care provider if: You have a fever. Your knee becomes red, tender, or swollen. Your pain medicine is not controlling your pain. Your symptoms get worse or do not improve after 2 weeks of home care. Summary A meniscus tear is a knee injury that happens when a piece of the meniscus is  torn. Treatment for this injury depends on the severity of the tear. You may need surgery if you have a severe tear or if other treatments fail. Rest, ice, and raise (elevate) your injured knee, as told by your health care provider. This will help lessen pain and swelling. Contact a health care provider if you have new symptoms, your symptoms worsen, or they do not improve after 2 weeks of home care. Keep all follow-up visits. This is important. This information is not intended to replace advice given to you by your health care provider. Make sure you discuss any questions you have with your health care provider. Document Revised: 04/19/2020 Document Reviewed: 04/19/2020 Elsevier Patient Education  2024 [employer]  Osteoarthritis  Osteoarthritis is a type of arthritis. It refers to joint pain or joint disease. Osteoarthritis affects tissue that covers the ends of bones in joints (cartilage). Cartilage acts as a cushion between the bones and helps them move smoothly. Osteoarthritis occurs when cartilage in the joints gets worn down. Osteoarthritis is sometimes called "wear and tear" arthritis. Osteoarthritis is the most common form of arthritis. It often occurs in older people. It is a condition that gets worse over time. The joints most often affected by this condition are in the fingers, toes, hips, knees, and spine, including the neck and lower back. What are the causes? This condition is caused by the wearing down of cartilage that covers the ends of bones. What increases the risk? The following factors may make you more likely to develop this condition: Being age 26 or older. Obesity. Overuse of joints. Past injury of a joint. Past surgery on a joint. Family history of osteoarthritis. What are the signs or symptoms? The main symptoms of this condition are pain, swelling, and stiffness in the joint. Other symptoms may include: An enlarged joint. More pain and further damage caused by  small pieces of bone or cartilage that break off and float inside of the joint. Small deposits of bone (osteophytes) that grow on the edges of the joint. A grating or scraping feeling inside the joint when you move it. Popping or creaking sounds when you move. Difficulty walking or exercising. An inability to grip items, twist your hand, or control the movements of your  hands and fingers. How is this diagnosed? This condition may be diagnosed based on: Your medical history. A physical exam. Your symptoms. X-rays of the affected joints. Blood tests to rule out other types of arthritis. How is this treated? There is no cure for this condition, but treatment can help control pain and improve joint function. Treatment may include a combination of therapies, such as: Pain relief techniques, such as: Applying heat and cold to the joint. Massage. A form of talk therapy called cognitive behavioral therapy (CBT). This therapy helps you set goals and follow up on the changes that you make. Medicines for pain and inflammation. The medicines can be taken by mouth or applied to the skin. They include: NSAIDs, such as ibuprofen. Prescription medicines. Strong anti-inflammatory medicines (corticosteroids). Certain nutritional supplements. A prescribed exercise program. You may work with a physical therapist. Assistive devices, such as a brace, wrap, splint, specialized glove, or cane. A weight control plan. Surgery, such as: An osteotomy. This is done to reposition the bones and relieve pain or to remove loose pieces of bone and cartilage. Joint replacement surgery. You may need this surgery if you have advanced osteoarthritis. Follow these instructions at home: Activity Rest your affected joints as told by your health care provider. Exercise as told by your provider. The provider may recommend specific types of exercise, such as: Strengthening exercises. These are done to strengthen the muscles  that support joints affected by arthritis. Aerobic activities. These are exercises, such as brisk walking or water aerobics, that increase your heart rate. Range-of-motion activities. These help your joints move more easily. Balance and agility exercises. Managing pain, stiffness, and swelling     If told, apply heat to the affected area as often as told by your provider. Use the heat source that your provider recommends, such as a moist heat pack or a heating pad. If you have a removable assistive device, remove it as told by your provider. Place a towel between your skin and the heat source. If your provider tells you to keep the assistive device on while you apply heat, place a towel between the assistive device and the heat source. Leave the heat on for 20-30 minutes. If told, put ice on the affected area. If you have a removable assistive device, remove it as told by your provider. Put ice in a plastic bag. Place a towel between your skin and the bag. If your provider tells you to keep the assistive device on during icing, place a towel between the assistive device and the bag. Leave the ice on for 20 minutes, 2-3 times a day. If your skin turns bright red, remove the ice or heat right away to prevent skin damage. The risk of damage is higher if you cannot feel pain, heat, or cold. Move your fingers or toes often to reduce stiffness and swelling. Raise (elevate) the affected area above the level of your heart while you are sitting or lying down. General instructions Take over-the-counter and prescription medicines only as told by your provider. Maintain a healthy weight. Follow instructions from your provider for weight control. Do not use any products that contain nicotine or tobacco. These products include cigarettes, chewing tobacco, and vaping devices, such as e-cigarettes. If you need help quitting, ask your provider. Use assistive devices as told by your provider. Where to find  more information [employer] of Arthritis and Musculoskeletal and Skin Diseases: niams.http://www.myers.net/ [employer] on Aging: BaseRingTones.pl American College of Rheumatology: rheumatology.org Contact a  health care provider if: You have redness, swelling, or a feeling of warmth in a joint that gets worse. You have a fever along with joint or muscle aches. You develop a rash. You have trouble doing your normal activities. You have pain that gets worse and is not relieved by pain medicine. This information is not intended to replace advice given to you by your health care provider. Make sure you discuss any questions you have with your health care provider. Document Revised: 08/07/2022 Document Reviewed: 08/07/2022 Elsevier Patient Education  2024 [employer].

## 2023-10-01 NOTE — Progress Notes (Signed)
Pt reports pain to her right hip and knee secondary to an injury that occurred over three years ago. Pt reports that she is having to "twist and pivot a lot at her job today and that is making it worse". Pt reports a 6/10 pain today.

## 2023-10-01 NOTE — Progress Notes (Signed)
Subjective:    Patient ID: Shari Shields, female    DOB: [birth year], 47 y.o.   MRN: [id number]  46y/o african [place] female established patient here for evaluation right knee and hip pain.  Established with [facility] stated was told surgical candidate but holding on surgery until after the holidays.  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  Trying to pick up and move entire foot instead of pivoting but something forgets and exacerbates pain.  Denied swelling, locking, giving out.  Sometimes pops.   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  Denied fever/chills/swelling/rash/bruising/falls/weakness in extremity.  Pains worsens as the day goes on.  Back/hip stiff in am now with colder weather also.  Has been working on losing weight.  Last prednisone use with covid infection 2023      Review of Systems  Constitutional:  Positive for activity change. Negative for chills, diaphoresis, fatigue and fever.  HENT:  Negative for trouble swallowing and voice change.   Eyes:  Negative for photophobia and visual disturbance.  Respiratory:  Negative for cough, shortness of breath, wheezing and stridor.   Cardiovascular:  Negative for leg swelling.  Gastrointestinal:  Negative for diarrhea, nausea and vomiting.  Genitourinary:  Negative for difficulty urinating.  Musculoskeletal:  Positive for arthralgias, back pain, gait problem and myalgias. Negative for joint swelling, neck pain and neck stiffness.  Skin:  Negative for color change, pallor, rash and wound.  Neurological:  Negative for dizziness, tremors, seizures, syncope, facial asymmetry, speech difficulty, weakness, light-headedness, numbness and  headaches.  Hematological:  Negative for adenopathy. Does not bruise/bleed easily.  Psychiatric/Behavioral:  Negative for agitation, confusion and sleep disturbance.        Objective:   Physical Exam Vitals and nursing note reviewed.  Constitutional:      General: She is awake. She is not in acute distress.    Appearance: Normal appearance. She is well-developed and well-groomed. She is morbidly obese. She is not ill-appearing, toxic-appearing or diaphoretic.  HENT:     Head: Normocephalic and atraumatic.     Jaw: There is normal jaw occlusion.     Salivary Glands: Right salivary gland is not diffusely enlarged. Left salivary gland is not diffusely enlarged.     Right Ear: Hearing and external ear normal. No decreased hearing noted.     Left Ear: Hearing and external ear normal. No decreased hearing noted.     Nose: Nose normal. No congestion or rhinorrhea.     Mouth/Throat:     Lips: Pink. No lesions.     Mouth: Mucous membranes are moist. No oral lesions or angioedema.     Pharynx: Oropharynx is clear.  Eyes:     General: Lids are normal. Vision grossly intact. Gaze aligned appropriately. Allergic shiner present. No scleral icterus.       Right eye: No discharge.        Left eye: No discharge.     Extraocular Movements: Extraocular movements intact.     Conjunctiva/sclera: Conjunctivae normal.     Pupils: Pupils are equal, round, and reactive to light.  Neck:     Trachea: Trachea and phonation normal.  Cardiovascular:  Rate and Rhythm: Normal rate and regular rhythm.     Pulses: Normal pulses.          Radial pulses are 2+ on the right side and 2+ on the left side.  Pulmonary:     Effort: Pulmonary effort is normal.     Breath sounds: Normal breath sounds and air entry. No stridor or transmitted upper airway sounds. No wheezing.     Comments: Spoke full sentences without difficulty; no cough observed in exam room Abdominal:     General: Abdomen is flat.   Musculoskeletal:        General: Tenderness present. No swelling, deformity or signs of injury.     Right hand: Normal strength. Normal capillary refill.     Left hand: Normal strength. Normal capillary refill.     Cervical back: Normal range of motion and neck supple. No swelling, edema, deformity, erythema, signs of trauma, lacerations, rigidity, spasms, torticollis, tenderness, bony tenderness or crepitus. No pain with movement, spinous process tenderness or muscular tenderness. Normal range of motion.     Thoracic back: No swelling, edema, deformity, signs of trauma, lacerations, spasms, tenderness or bony tenderness. Normal range of motion.     Lumbar back: Tenderness present. No swelling, edema, deformity, signs of trauma, lacerations, spasms or bony tenderness. Decreased range of motion. No scoliosis.     Right hip: Tenderness present. No deformity, lacerations, bony tenderness or crepitus. Decreased range of motion. Normal strength.     Left hip: No deformity, lacerations, tenderness, bony tenderness or crepitus. Normal range of motion. Normal strength.     Right knee: No swelling, deformity, effusion, erythema, ecchymosis, lacerations, bony tenderness or crepitus. Decreased range of motion. Tenderness present over the lateral joint line and MCL. No LCL laxity, MCL laxity, ACL laxity or PCL laxity.     Instability Tests: Anterior drawer test negative. Posterior drawer test negative. Anterior Lachman test negative. Medial McMurray test positive.     Left knee: No swelling, deformity, effusion, erythema, ecchymosis, lacerations, bony tenderness or crepitus. Decreased range of motion. No tenderness. No medial joint line, lateral joint line, MCL, LCL, ACL, PCL or patellar tendon tenderness. No LCL laxity, MCL laxity, ACL laxity or PCL laxity.    Right lower leg: No swelling, deformity, lacerations or tenderness. No edema.     Left lower leg: No swelling, deformity, lacerations or tenderness. No  edema.       Legs:     Comments: Lateral right hip pain from superior iliac posterior crest soft tissue, IT band; SI joints bilaterally not TTP; knee arom decreased right greater than left stops due to pain per patient foot 12 inches from buttock for flexion; no crepitus knee with arom; right hip abduction greater than adduction pain; flexion greater than extension pain right only.  AROM bilateral hips equal; knees right flexion less than left by 10 degrees; pain right knee with valgus stress test but no laxity noted compared to left equal  Lymphadenopathy:     Head:     Right side of head: No submandibular or preauricular adenopathy.     Left side of head: No submandibular or preauricular adenopathy.     Cervical: No cervical adenopathy.     Right cervical: No superficial cervical adenopathy.    Left cervical: No superficial cervical adenopathy.  Skin:    General: Skin is warm and dry.     Capillary Refill: Capillary refill takes less than 2 seconds.     Coloration: Skin is  not ashen, cyanotic, jaundiced, mottled, pale or sallow.     Findings: No abrasion, abscess, acne, bruising, burn, ecchymosis, erythema, signs of injury, laceration, lesion, petechiae, rash or wound.     Nails: There is no clubbing.  Neurological:     General: No focal deficit present.     Mental Status: She is alert and oriented to person, place, and time. Mental status is at baseline.     GCS: GCS eye subscore is 4. GCS verbal subscore is 5. GCS motor subscore is 6.     Cranial Nerves: Cranial nerves 2-12 are intact. No cranial nerve deficit, dysarthria or facial asymmetry.     Sensory: Sensation is intact.     Motor: Motor function is intact. No weakness, tremor, atrophy, abnormal muscle tone or seizure activity.     Coordination: Coordination is intact. Coordination normal.     Gait: Gait is intact. Gait normal.     Comments: In/out of chair without difficulty; gait sure and steady in clinic; bilateral hand grasp  equal 5/5  Psychiatric:        Attention and Perception: Attention and perception normal.        Mood and Affect: Mood and affect normal.        Speech: Speech normal.        Behavior: Behavior normal. Behavior is cooperative.        Thought Content: Thought content normal.        Cognition and Memory: Cognition and memory normal.        Judgment: Judgment normal.     Epic care everywhere reviewed from [facility] 2022: 1. Right knee arthritis, primarily medial compartment and patellofemoral compartment with MRI showing presence of posterior root tear of the medial meniscus and chondromalacia within the medial compartment with associated osteophyte formation.  2. Left knee patellofemoral pain with MRI evidence of full-thickness lateral patellar facet chondral damage and subchondral bone marrow edema, also with lateral compartment chondromalacia, intact menisci.   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  Intact menisci and ligaments.  Tiny ruptured popliteal cyst. Narrative  EXAM: MRI LOWER EXTREMITY JOINT LEFT WO CONTRAST DATE: 04/24/2021 ACCESSION: [id number] UN DICTATED: 04/24/2021 10:26 AM INTERPRETATION LOCATION: [facility]  CLINICAL INDICATION: 47 years old Female with Patellofemoral disorder of left knee  - M22.2X2 - Patellofemoral disorder of left knee    COMPARISON: None.  TECHNIQUE: MRI of the Left knee was performed without contrast using a local coil.  Multisequence, multiplanar images were obtained.  FINDINGS:  Bone marrow (excluding subchondral bone): Within normal limits  Medial compartment: The medial meniscus, articular cartilage, and subchondral bone are normal.  Lateral compartment: Intact lateral meniscus. Few small regions of cartilage thinning and deep fissuring of  the tibial plateau. No full-thickness cartilage defect.  Patellofemoral compartment: Diffuse cartilage thinning, greatest at the lateral patellar facet with region of full-thickness cartilage thinning and subchondral cystic change. Moderate thinning of the adjacent femoral trochlear cartilage.  The ligaments of the knee are intact.  The extensor mechanism is normal.  The medial and lateral patellar retinacula are normal. The popliteus tendon is normal.  No effusion. Tiny ruptured popliteal cyst.  There are no loose intra-articular bodies.  Mild diffuse subcutaneous edema. Procedure Note  Lindie Spruce, MD - 04/24/2021 Formatting of this note might be different from the original.  EXAM: MRI LOWER EXTREMITY JOINT LEFT WO CONTRAST DATE: 04/24/2021 ACCESSION: [id number] UN DICTATED: 04/24/2021 10:26 AM INTERPRETATION LOCATION: [facility]  CLINICAL INDICATION: 47 years old Female with Patellofemoral disorder of left knee  - M22.2X2 - Patellofemoral disorder of left knee    COMPARISON: None.  TECHNIQUE: MRI of the Left knee was performed without contrast using a local coil.  Multisequence, multiplanar images were obtained.  FINDINGS:  Bone marrow (excluding subchondral bone): Within normal limits  Medial compartment: The medial meniscus, articular cartilage, and subchondral bone are normal.  Lateral compartment: Intact lateral meniscus. Few small regions of cartilage thinning and deep fissuring of the tibial plateau. No full-thickness cartilage defect.  Patellofemoral compartment: Diffuse cartilage thinning, greatest at the lateral patellar facet with region of full-thickness cartilage thinning and subchondral cystic change. Moderate thinning of the adjacent femoral trochlear cartilage.  The ligaments of the knee are intact.  The extensor mechanism is normal.  The medial and lateral patellar retinacula are normal. The popliteus tendon is normal.  No effusion. Tiny ruptured  popliteal cyst.  There are no loose intra-articular bodies.  Mild diffuse subcutaneous edema.   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  Intact menisci and ligaments.  Tiny ruptured popliteal cyst. Exam End: 04/24/21 10:10   Specimen Collected: 04/24/21 10:26 Last Resulted: 04/24/21 10:31  Received From: [facility]  Result Received: 06/02/23 10:12        Assessment & Plan:  A-chronic right knee pain, lumbar radiculopathy subsequent visits  P-Prednisone 10mg  taper 30mg  x 2 days with breakfast then 20mg  x 2 days then 10mg  x 2 days #21 RF0 dispensed from [facility] to patient today.  Discussed hip pain related to altered gait due to knee pain.  Working different job this week more labor intensive and standing more hours straining knee.  May use leftover cyclobenazeprine/flexeril 10mg  sig t1/2-1 po TID prn muscle spasms #30 RF0 previously dispensed from [facility].  Ibuprofen 800mg  po TID prn pain #30 RF0 previously dispensed from [facility].  Avoid alcohol intake and driving while taking cyclobenazeprine/flexeril as drowsiness common side effect.  Slow position changes as medication also lower blood pressure.  Home stretches demonstrated to patient-e.g. cobra, superman alternating arms/legs, chest stretches, neck AROM, chin tucks, knee to chest and rock side to side on back. Self massage or professional prn, foam roller use or tennis/racquetball.  Heat/cryotherapy 15 minutes QID prn.  Trial thermacare 1 applied right hip and another given to patient for use tomorrow from clinic stock.  Biofreeze gel apply topical affected area up to QID given 4 UD from clinic stock.  Consider epsom salt soak after work.  Discussed prolonged static positions e.g. sitting/lying typically worsen pain.  Restart your home physical therapy exercises as had stopped.  Consider follow up appt with orthopedics if no improvement with plan of care   Move feet instead of twisting knee as can exercerbate meniscal injury  Continue wear of knee neoprene brace for support at work and when walking.  Ensure ergonomics correct desk at work avoid repetitive motions if possible/holding phone/laptop in hand use desk/stand and/or break up lifting items into smaller loads/weights.  Patient was instructed to rest, ice, and ROM exercises.  Activity as tolerated.   Follow up if symptoms persist or worsen especially if loss of bowel/bladder control, arm/leg weakness and/or saddle paresthesias.  Exitcare handouts on meniscal injury/rehab exercises/arthritis  If unable to perform duties/needs restrictions will need appt with worker's comp clinic provider HR or  clinic staff to schedule as contract limitations prohibit me from writing work restrictions in this clinic. Patient verbalized agreement and understanding of treatment plan and had no further questions at this time.  P2:  Injury Prevention and Fitness.

## 2023-10-13 ENCOUNTER — Encounter: Payer: Self-pay | Admitting: Registered Nurse

## 2023-10-13 ENCOUNTER — Ambulatory Visit: Payer: No Typology Code available for payment source | Admitting: Registered Nurse

## 2023-10-13 VITALS — BP 147/85 | HR 78 | Temp 97.2°F | Resp 16

## 2023-10-13 DIAGNOSIS — I8312 Varicose veins of left lower extremity with inflammation: Secondary | ICD-10-CM

## 2023-10-13 DIAGNOSIS — M7651 Patellar tendinitis, right knee: Secondary | ICD-10-CM

## 2023-10-13 NOTE — Progress Notes (Signed)
Subjective:    Patient ID: Shari Shields, female    DOB: Dec 14, 1976, 47 y.o.   MRN: [id number]  46y/o african [place] established female with worsening left knee pain.  Worked belt loading semi trailer week prior and right knee pain worsening from twisting.  Last week left knee worsening pain.  Applied knee sleeve neoprene helped a little but still having pain anterior knee and back of knee.  Spouse noticed vein bulging back of leg and here for evaluation as spouse with history of DVT/hospitalization.  Denied fever/chills/rash/hot spot on leg or swelling.  Has noticed left knee sleeve losing elasticity wondering if she can obtain new one.  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  Using ibuprofen 800mg  po TID prn.  Does not have reusable ice/hot pack at home.  Denied locking or giving out/falls bilateral knees.  Wearing bilateral neoprene sleeves today.  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  HENT:  Negative for trouble swallowing and voice change.   Eyes:  Negative for photophobia and visual disturbance.  Respiratory:  Negative for cough.   Cardiovascular:  Negative for chest pain, palpitations and leg swelling.  Gastrointestinal:  Negative for abdominal pain, diarrhea, nausea and vomiting.  Genitourinary:  Negative for difficulty urinating.  Musculoskeletal:  Positive for arthralgias, gait problem, joint swelling and myalgias. Negative for back pain, neck pain and neck stiffness.  Skin:  Negative for color change, rash and wound.  Neurological:  Negative for dizziness, tremors, syncope, facial asymmetry, speech difficulty, weakness, light-headedness, numbness and headaches.   Hematological:  Negative for adenopathy. Does not bruise/bleed easily.  Psychiatric/Behavioral:  Negative for agitation, confusion and sleep disturbance.        Objective:   Physical Exam Vitals and nursing note reviewed.  Constitutional:      General: She is awake. She is not in acute distress.    Appearance: She is well-developed and well-groomed. She is obese. She is not ill-appearing, toxic-appearing or diaphoretic.  HENT:     Head: Normocephalic and atraumatic.     Jaw: There is normal jaw occlusion.     Salivary Glands: Right salivary gland is not diffusely enlarged or tender. Left salivary gland is not diffusely enlarged or tender.     Right Ear: Hearing and external ear normal. No decreased hearing noted.     Left Ear: Hearing and external ear normal. No decreased hearing noted.     Nose: Nose normal. No congestion or rhinorrhea.     Mouth/Throat:     Lips: Pink. No lesions.     Mouth: Mucous membranes are moist. No oral lesions or angioedema.     Dentition: No gum lesions.     Tongue: No lesions. Tongue does not deviate from midline.     Palate: No mass and lesions.     Pharynx: Oropharynx is clear. Uvula midline.  Eyes:     General: Lids are normal. Vision grossly intact. Gaze aligned appropriately. Allergic shiner present. No scleral icterus.       Right eye: No discharge.        Left eye: No discharge.     Extraocular Movements: Extraocular movements intact.     Conjunctiva/sclera: Conjunctivae normal.  Pupils: Pupils are equal, round, and reactive to light.  Neck:     Trachea: Trachea and phonation normal.  Cardiovascular:     Rate and Rhythm: Normal rate and regular rhythm.     Pulses: Normal pulses.          Radial pulses are 2+ on the right side and 2+ on the left side.       Popliteal pulses are 2+ on the right side and 2+ on the left side.     Heart sounds: Normal heart sounds, S1 normal and S2 normal. Heart sounds not distant. No murmur  heard. Pulmonary:     Effort: Pulmonary effort is normal. No respiratory distress.     Breath sounds: Normal breath sounds and air entry. No stridor or transmitted upper airway sounds. No decreased breath sounds, wheezing, rhonchi or rales.     Comments: Spoke full sentences without difficulty; no cough observed in exam room Abdominal:     General: There is no distension.     Palpations: Abdomen is soft.     Tenderness: There is no guarding.  Musculoskeletal:        General: Tenderness present. No signs of injury.     Right hand: Normal strength. Normal capillary refill.     Left hand: Normal strength. Normal capillary refill.     Cervical back: Normal range of motion and neck supple. No swelling, edema, deformity, erythema, signs of trauma, lacerations, rigidity, spasms, tenderness or crepitus. No pain with movement. Normal range of motion.     Thoracic back: No swelling, edema, deformity, signs of trauma, lacerations, spasms or tenderness. Normal range of motion.     Right knee: Crepitus present. No swelling, deformity, effusion, erythema, ecchymosis or lacerations. Decreased range of motion. Tenderness present over the patellar tendon. Normal meniscus.     Instability Tests: Anterior drawer test negative. Posterior drawer test negative. Medial McMurray test negative and lateral McMurray test negative.     Left knee: Crepitus present. No swelling, deformity, effusion, erythema, ecchymosis or lacerations. Decreased range of motion. Tenderness present over the patellar tendon. Normal meniscus.     Instability Tests: Anterior drawer test negative. Posterior drawer test negative. Medial McMurray test negative and lateral McMurray test negative.     Right lower leg: Tenderness present. No swelling, deformity or lacerations. No edema.     Left lower leg: Tenderness present. No swelling, deformity or lacerations. No edema.     Right ankle: No swelling, deformity, ecchymosis or lacerations. No  tenderness. Normal range of motion. Anterior drawer test negative.     Right Achilles Tendon: Tenderness present. No defects.     Left ankle: No swelling, deformity, ecchymosis or lacerations. No tenderness. Normal range of motion. Anterior drawer test negative.     Left Achilles Tendon: Tenderness present. No defects.     Right foot: Normal capillary refill. No swelling, tenderness or crepitus.     Left foot: Normal capillary refill. No swelling, tenderness or crepitus.       Legs:     Comments: Diameter left knee central patella/posterior fossa 20"; palpable patellar crepitus bilateral knees; decreased AROM flexion bilaterally left greater than right today; bilateral gastrocnemius mildly TTP along with proximal achilles greater than distal achilles TTP  Lymphadenopathy:     Head:     Right side of head: No submandibular or preauricular adenopathy.     Left side of head: No submandibular or preauricular adenopathy.     Cervical: No cervical adenopathy.  Right cervical: No superficial cervical adenopathy.    Left cervical: No superficial cervical adenopathy.  Skin:    General: Skin is warm and dry.     Capillary Refill: Capillary refill takes less than 2 seconds.     Coloration: Skin is not ashen, cyanotic, jaundiced, mottled, pale or sallow.     Findings: No abrasion, abscess, acne, bruising, burn, ecchymosis, erythema, signs of injury, laceration, lesion, petechiae, rash or wound.     Nails: There is no clubbing.       Neurological:     General: No focal deficit present.     Mental Status: She is alert and oriented to person, place, and time. Mental status is at baseline.     GCS: GCS eye subscore is 4. GCS verbal subscore is 5. GCS motor subscore is 6.     Cranial Nerves: Cranial nerves 2-12 are intact. No cranial nerve deficit, dysarthria or facial asymmetry.     Motor: Motor function is intact. No weakness, tremor, atrophy, abnormal muscle tone or seizure activity.      Coordination: Coordination is intact. Coordination normal.     Gait: Gait is intact. Gait normal.     Comments: In/out of chair without difficulty; gait sure and steady in clinic; bilateral hand grasp equal 5/5  Psychiatric:        Attention and Perception: Attention and perception normal.        Mood and Affect: Mood and affect normal.        Speech: Speech normal.        Behavior: Behavior normal. Behavior is cooperative.        Thought Content: Thought content normal.        Cognition and Memory: Cognition and memory normal.        Judgment: Judgment normal.           Assessment & Plan:  A-patellar tendonitis both knees; varicose veins left lower extremity  P-Gave 2xl neoprene sleeve knee from clinic stock fitted and distributed.  Discussed hand wash and air dry only.  May use hand held hair dryer if needed prn drying but do not put in drying maching. Hot/cold pack reusable from clinic stock given discussed use no not heat for more than 30 seconds in microwave and use hand towel to handle to avoid burns.  15 minutes QID prn pain ice or heat or combination.  Consider epsom salt bath after work.  Tylenol 1000mg  po q6h prn pain given 4 UD from clinic stock.  Has ibuprofen 800mg  po TID at home. Discussed dvt symptoms e.g. red streaks, swelling localized that then spreads up/down leg, homan's sign, dyspnea, chest pain if occurs same day evaluation with a provider Wear compression leggings to help support varicose veins in lower legs(distal hamstring left) this week/when standing for shifts.  Left knee sleeve covers affected area also/compresses [facility] handouts on varicose veins, DVT and patellar tendonitis and rehab exercises printed and given to patient. Scheduled follow up with orthopedics.  Continue weight loss efforts.  Work on [employer], advertising.  Avoid high impact activities at this time.  Patient agreed with plan of care and had no further questions at this time.

## 2023-10-13 NOTE — Patient Instructions (Signed)
Deep Vein Thrombosis  Deep vein thrombosis (DVT) is a condition in which a blood clot forms in a vein of the deep venous system. This can occur in the lower leg, thigh, pelvis, arm, or neck. A clot is blood that has thickened into a gel or solid. This condition is serious and can be life-threatening if the clot travels to the arteries of the lungs and causes a blockage (pulmonary embolism). A DVT can also damage veins in the leg, which can lead to long-term venous disease, leg pain, swelling, discoloration, and ulcers or sores (post-thrombotic syndrome). What are the causes? This condition may be caused by: A slowdown of blood flow. Damage to a vein. A condition that causes blood to clot more easily, such as certain bleeding disorders. What increases the risk? The following factors may make you more likely to develop this condition: Obesity. Being older, especially older than age 110. Being inactive or not moving around (sedentary lifestyle). This may include: Sitting or lying down for longer than 4-6 hours other than to sleep at night. Being in the hospital, or having major or lengthy surgery. Having any recent bone injuries, such as breaks (fractures), that reduce movement, especially in the lower extremities. Having recent orthopedic surgery on the lower extremities. Being pregnant, giving birth, or having recently given birth. Taking medicines that contain estrogen, such as birth control or hormone replacement therapy. Using products that contain nicotine or tobacco, especially if you use hormonal birth control. Having a history of a blood vessel disease (peripheral vascular disease) or congestive heart disease. Having a history of cancer, especially if being treated with chemotherapy. What are the signs or symptoms? Symptoms of this condition include: Swelling, pain, pressure, or tenderness in an arm or a leg. An arm or a leg becoming warm, red, or discolored. A leg turning very pale or  blue. You may have a large DVT. This is rare. If the clot is in your leg, you may notice that symptoms get worse when you stand or walk. In some cases, there are no symptoms. How is this diagnosed? This condition is diagnosed with: Your medical history and a physical exam. Tests, such as: Blood tests to check how well your blood clots. Doppler ultrasound. This is the best way to find a DVT. CT venogram. Contrast dye is injected into a vein, and X-rays are taken to check for clots. This is helpful for veins in the chest or pelvis. How is this treated? Treatment for this condition depends on: The cause of your DVT. The size and location of your DVT, or having more than one DVT. Your risk for bleeding or developing more clots. Other medical conditions you may have. Treatment may include: Taking a blood thinner medicine (anticoagulant) to prevent more clots from forming or current clots from growing. Wearing compression stockings. Injecting medicines into the affected vein to break up the clot (catheter-directed thrombolysis). Surgical procedures, when DVT is severe or hard to treat. These may be done to: Isolate and remove your clot. Place an inferior vena cava (IVC) filter. This filter is placed into a large vein called the inferior vena cava to catch blood clots before they reach your lungs. You may get some medical treatments for 6 months or longer. Follow these instructions at home: If you are taking blood thinners: Talk with your health care provider before you take any medicines that contain aspirin or NSAIDs, such as ibuprofen. These medicines increase your risk for dangerous bleeding. Take your medicine exactly  as told, at the same time every day. Do not skip a dose. Do not take more than the prescribed dose. This is important. Ask your health care provider about foods and medicines that could change or interact with the way your blood thinner works. Avoid these foods and medicines  if you are told to do so. Avoid anything that may cause bleeding or bruising. You may bleed more easily while taking blood thinners. Be very careful when using knives, scissors, or other sharp objects. Use an electric razor instead of a blade. Avoid activities that could cause injury or bruising, and follow instructions for preventing falls. Tell your health care provider if you have had any internal bleeding, bleeding ulcers, or neurologic diseases, such as strokes or cerebral aneurysms. Wear a medical alert bracelet or carry a card that lists what medicines you take. General instructions Take over-the-counter and prescription medicines only as told by your health care provider. Return to your normal activities as told by your health care provider. Ask your health care provider what activities are safe for you. If recommended, wear compression stockings as told by your health care provider. These stockings help to prevent blood clots and reduce swelling in your legs. Never wear your compression stockings while sleeping at night. Keep all follow-up visits. This is important. Where to find more information American Heart Association: www.heart.org Centers for Disease Control and Prevention: FootballExhibition.com.br National Heart, Lung, and Blood Institute: PopSteam.is Contact a health care provider if: You miss a dose of your blood thinner. You have unusual bruising or other color changes. You have new or worse pain, swelling, or redness in an arm or a leg. You have worsening numbness or tingling in an arm or a leg. You have a significant color change (pale or blue) in the extremity that has the DVT. Get help right away if: You have signs or symptoms that a blood clot has moved to the lungs. These may include: Shortness of breath. Chest pain. Fast or irregular heartbeats (palpitations). Light-headedness, dizziness, or fainting. Coughing up blood. You have signs or symptoms that your blood is  too thin. These may include: Blood in your vomit, stool, or urine. A cut that will not stop bleeding. A menstrual period that is heavier than usual. A severe headache or confusion. These symptoms may be an emergency. Get help right away. Call 911. Do not wait to see if the symptoms will go away. Do not drive yourself to the hospital. Summary Deep vein thrombosis (DVT) happens when a blood clot forms in a deep vein. This may occur in the lower leg, thigh, pelvis, arm, or neck. Symptoms affect the arm or leg and can include swelling, pain, tenderness, warmth, redness, or discoloration. This condition may be treated with medicines. In severe cases, a procedure or surgery may be done to remove or dissolve the clots. If you are taking blood thinners, take them exactly as told. Do not skip a dose. Do not take more than is prescribed. Get help right away if you have a severe headache, shortness of breath, chest pain, fast or irregular heartbeats, or blood in your vomit, urine, or stool. This information is not intended to replace advice given to you by your health care provider. Make sure you discuss any questions you have with your health care provider. Document Revised: 07/01/2021 Document Reviewed: 07/01/2021 Elsevier Patient Education  2024 [employer] Varicose Veins Varicose veins are veins that have become enlarged, bulged, and twisted. They most often appear in  the legs. What are the causes? This condition is caused by damage to the valves in the vein. These valves help blood return to your heart. When they are damaged and they stop working properly, blood may flow backward and back up in the veins near the skin, causing the veins to get larger and appear twisted. The condition can result from any issue that causes blood to back up, like pregnancy, prolonged standing, or obesity. What increases the risk? The following factors may make you more likely to develop this condition: Being on  your feet a lot. Being pregnant. Being overweight. Smoking. Having had a previous deep vein thrombosis or having a thrombotic disorder. Aging. The risk increases with age. Having a condition called Klippel-Trenaunay syndrome. What are the signs or symptoms? Symptoms of this condition include: Bulging, twisted, and bluish veins. A feeling of heaviness in your legs. This may be worse at the end of the day. Leg pain. This may be worse at the end of the day. Swelling in the leg. Changes in skin color over the veins. Swelling or pain in the legs can limit your activities. Your symptoms may get worse when you sit or stand for long periods of time. How is this diagnosed? This condition may be diagnosed based on: Your symptoms, family history, activity levels, and lifestyle. A physical exam. You may also have tests, including an ultrasound or X-ray. How is this treated? Treatment for this condition may involve: Avoiding sitting or standing in one position for long periods of time. Wearing compression stockings. These stockings help to prevent blood clots and reduce swelling in the legs. Raising (elevating) the legs when resting. Losing weight. Exercising regularly. If you have persistent symptoms or want to improve the way your varicose veins look, you may choose to have a procedure to close the varicose veins off or to remove them. Nonsurgical treatments to close off the veins include: Sclerotherapy. In this treatment, a solution is injected into a vein to close it off. Laser treatment. The vein is heated with a laser to close it off. Radiofrequency vein ablation. An electrical current produced by radio waves is used to close off the vein. Surgical treatments to remove the veins include: Phlebectomy. In this procedure, the veins are removed through small incisions made over the veins. Vein ligation and stripping. In this procedure, incisions are made over the veins. The veins are then  removed after being tied (ligated) with stitches (sutures). Follow these instructions at home: Medicines Take over-the-counter and prescription medicines only as told by your health care provider. If you were prescribed an antibiotic medicine, use it as told by your health care provider. Do not stop using the antibiotic even if you start to feel better. Activity Walk as much as possible. Walking increases blood flow. This helps blood return to the heart and takes pressure off your veins. Do not stand or sit in one position for a long period of time. Do not sit with your legs crossed. Avoid sitting for a long time without moving. Get up to take short walks every 1-2 hours. This is important to improve blood flow and breathing. Ask for help if you feel weak or unsteady. Return to your normal activities as told by your health care provider. Ask your health care provider what activities are safe for you. Do exercises as told by your health care provider. General instructions  Follow any diet instructions given to you by your health care provider. Elevate your legs at  night to above the level of your heart. If you get a cut in the skin over the varicose vein and the vein bleeds: Lie down with your leg raised. Apply firm pressure to the cut with a clean cloth until the bleeding stops. Place a bandage (dressing) on the cut. Drink enough fluid to keep your urine pale yellow. Do not use any products that contain nicotine or tobacco. These products include cigarettes, chewing tobacco, and vaping devices, such as e-cigarettes. If you need help quitting, ask your health care provider. Wear compression stockings as told by your health care provider. Do not wear other kinds of tight clothing around your legs, pelvis, or waist. Keep all follow-up visits. This is important. Contact a health care provider if: The skin around your varicose veins starts to break down. You have more pain, redness, tenderness,  or hard swelling over a vein. You are uncomfortable because of pain. You get a cut in the skin over a varicose vein and it will not stop bleeding. Get help right away if: You have chest pain. You have trouble breathing. You have severe leg pain. Summary Varicose veins are veins that have become enlarged, bulged, and twisted. They most often appear in the legs. This condition is caused by damage to the valves in the vein. These valves help blood return to your heart. Treatment for this condition includes frequent movements, wearing compression stockings, losing weight, and exercising regularly. In some cases, procedures are done to close off or remove the veins. Nonsurgical treatments to close off the veins include sclerotherapy, laser therapy, and radiofrequency vein ablation. This information is not intended to replace advice given to you by your health care provider. Make sure you discuss any questions you have with your health care provider. Document Revised: 05/22/2021 Document Reviewed: 05/22/2021 Elsevier Patient Education  2024 [employer] Patellar Tendinitis Rehab Ask your health care provider which exercises are safe for you. Do exercises exactly as told by your health care provider and adjust them as directed. It is normal to feel mild stretching, pulling, tightness, or discomfort as you do these exercises. Stop right away if you feel sudden pain or your pain gets worse. Do not begin these exercises until told by your health care provider. Stretching and range-of-motion exercise This exercise warms up your muscles and joints and improves the movement and flexibility of your knee. The exercise also helps to relieve pain and stiffness. Hamstring, doorway stretch This is an exercise in which you lie in a doorway and prop your leg on a wall to stretch the back of your knee and thigh (hamstring). Lie on your back in front of a doorway with your left / right leg resting against the wall  and your other leg flat on the floor in the doorway. There should be a slight bend in your left / right knee. Straighten your left / right knee. You should feel a stretch behind your knee or thigh. If you do not, scoot your buttocks closer to the door. Hold this position for _____30_____ seconds. Repeat ____3______ times. Complete this exercise _____2_____ times a day. Strengthening exercises These exercises build strength and endurance in your knee. Endurance is the ability to use your muscles for a long time, even after they get tired. Quadriceps, isometric This exercise stretches the muscles in front of your thigh (quadriceps) without moving your knee joint (isometric). Lie on your back with your left / right leg extended and your other knee bent. Slowly tense the  muscles in the front of your left / right thigh. When you do this, you should see your kneecap slide up toward your hip or see increased dimpling just above the knee. This motion will push the back of your knee toward the floor. If this is painful, try putting a rolled-up hand towel under your knee to support it in a bent position. Change the size of the towel to find a position that allows you to do this exercise without any pain. For ____30______ seconds, hold the muscle as tight as you can without increasing your pain. Relax the muscles slowly and completely. Repeat ____3______ times. Complete this exercise ____2______ times a day. Straight leg raises, flexors This exercise stretches the muscles in front of your thigh (quadriceps) and the muscles that move your hips (hip flexors). Lie on your back with your left / right leg extended and your other knee bent. Tense the muscles in the front of your left / right thigh. When you do this, you should see your kneecap slide up or see increased dimpling just above the knee. Keep these muscles tight as you raise your leg 4-6 inches (10-15 cm) off the floor. Do not let your moving knee  bend. Hold this position for ____30______ seconds. Keep these muscles tense as you slowly lower your leg. Relax your muscles slowly and completely. Repeat _____3_____ times. Complete this exercise ____2______ times a day. Squats This is a weight-bearing exercise in which you bend your knees and lower your hips while engaging your thigh muscles. Stand in front of a table, with your feet and knees pointing straight ahead. You may rest your hands on the table for balance but not for support. Slowly bend your knees and lower your hips like you are going to sit in a chair. Keep your weight over your heels, not over your toes. Keep your lower legs upright so they are parallel with the table legs. Do not let your hips go lower than your knees. Do not bend lower than told by your health care provider. If your knee pain increases, do not bend as low. Hold the squat position for ____30______ seconds. Slowly push with your legs to return to standing. Do not use your hands to pull yourself to standing. Repeat _____3_____ times. Complete this exercise ___2_______ times a day. Step-downs This is an exercise in which you step down slowly while engaging your leg muscles. Stand on the edge of a step. Keeping your weight over your left / right heel, slowly bend your left / right knee to bring your left / right heel toward the floor. Lower your heel as far as you can while keeping control and without increasing any discomfort. Do not let your left / right knee come forward. Use your leg muscles, not gravity, to lower your body. Hold on to a wall or rail for balance if needed. Slowly push through your heel to lift your body weight back up. Return to the starting position. Repeat ___3_______ times. Complete this exercise ______2____ times a day. Straight leg raises, abductors This exercise strengthens the muscles that rotate the leg at the hip and move it away from your body (hip abductors). Lie on your side  with your left / right leg in the top position. Lie so your head, shoulder, knee, and hip line up. You may bend your bottom knee to help you keep your balance. Roll your hips slightly forward, so that your hips are stacked directly over each other and your  left / right knee is facing forward. Leading with your heel, lift your top leg 4-6 inches (10-15 cm). You should feel the muscles in your outer hip lifting. Do not let your foot drift forward. Do not let your knee roll toward the ceiling. Hold this position for ____30______ seconds. Slowly lower your leg to the starting position. Let your muscles relax completely after each repetition. Repeat ____3_____ times. Complete this exercise ___2_______ times a day. This information is not intended to replace advice given to you by your health care provider. Make sure you discuss any questions you have with your health care provider. Document Revised: 07/16/2021 Document Reviewed: 07/16/2021 Elsevier Patient Education  2024 [employer]. Patellar Tendinitis  Patellar tendinitis is also called jumper's knee or patellar tendinopathy. This condition happens when there is damage to the patellar tendon. Tendons are cord-like tissues that connect muscles to bones. The patellar tendon connects the bottom of the kneecap (patella) to the top of the shin bone (tibia). Patellar tendinitis causes pain in the front of the knee. The condition is classified into the following stages: Stage 1: You have pain only after activity. Stage 2: You have pain during and after activity. Stage 3: You have pain at rest as well as during and after activity. The pain limits your ability to do the activity. Stage 4: You have tendon tears. The tears severely limit your activity. What are the causes? This condition is caused by repeated (repetitive) stress on the tendon. This stress may cause the tendon to stretch, swell, thicken, or tear. What increases the risk? The following  factors may make you more likely to develop this condition: Participating in sports that involve running, kicking, and jumping, especially on hard surfaces. These include: Basketball. Volleyball. Soccer. Track and field. Training too hard. Having tight thigh muscles. Having received steroid injections in the tendon. Having had knee surgery. Being 42-66 years old. Having rheumatoid arthritis, diabetes, or kidney disease. These conditions interrupt blood flow to the knee, causing the tendon to weaken. What are the signs or symptoms? The main symptom of this condition is pain and swelling in the front of the knee. The pain usually starts slowly and gradually gets worse. It may become painful to straighten your leg. The pain may get worse when you walk, run, or jump. How is this diagnosed? This condition may be diagnosed based on: Your symptoms. Your medical history. A physical exam. During the physical exam, your health care provider may check for: Tenderness along the tendon just below the patella. Tightness in your thigh muscles. Pain when you straighten your knee. Imaging tests, including: X-rays. These will show the position and condition of your patella. An MRI. This will show any abnormality of the tendon. Ultrasound. This will show any swelling or other abnormalities of the tendon. How is this treated? Treatment for this condition depends on the stage of the condition. It may involve: Avoiding activities that cause pain, such as jumping. Icing and elevating your knee. Having sound wave stimulation to promote healing. Doing physical therapy exercises to improve movement and strength in your knee when pain and swelling improve. Wearing a knee brace. This may be needed if your condition does not improve with treatment. Using crutches or a walker. This may be needed if your condition does not improve with treatment. Surgery. This may be done if you have stage 4 tendinitis. Follow  these instructions at home: If you have a removable brace: Wear the brace as told by your  health care provider. Remove it only as told by your health care provider. Check the skin around the brace every day. Tell your health care provider about any concerns. Loosen the brace if your toes tingle, become numb, or turn cold and blue. Keep the brace clean. If the brace is not waterproof: Do not let it get wet. Cover it with a watertight covering when you take a bath or shower. Ask your health care provider when it is safe for you to drive. Managing pain, stiffness, and swelling  If directed, put ice on the injured area. To do this: If you have a removable brace, remove it as told by your health care provider. Put ice in a plastic bag. Place a towel between your skin and the bag. Leave the ice on for 20 minutes, 2-3 times a day. Remove the ice if your skin turns bright red. This is very important. If you cannot feel pain, heat, or cold, you have a greater risk of damage to the area. Move your toes often to reduce stiffness and swelling. Raise (elevate) your knee above the level of your heart while you are sitting or lying down. Activity Do not use the injured limb to support your body weight until your health care provider says that you can. Use your crutches or a walker as told by your health care provider. Do exercises as told by your health care provider or physical therapist. Return to your normal activities as told by your health care provider. Ask your health care provider what activities are safe for you. General instructions Take over-the-counter and prescription medicines only as told by your health care provider. Do not use any products that contain nicotine or tobacco. These products include cigarettes, chewing tobacco, and vaping devices, such as e-cigarettes. These can delay healing. If you need help quitting, ask your health care provider. Keep all follow-up visits. This is  important. How is this prevented? Warm up and stretch before being active. Cool down and stretch after being active. Give your body time to rest between periods of activity. You may need to reduce how often you play a sport that requires frequent jumping. Make sure to use equipment that fits you. Be safe and responsible while being active. This will help you avoid falls which can damage the tendon. Do at least 150 minutes of moderate-intensity exercise each week, such as brisk walking or water aerobics. Maintain physical fitness, including: Strength. Flexibility. Cardiovascular fitness. Endurance. Contact a health care provider if: Your symptoms have not improved in 6 weeks. Your symptoms get worse. Summary Patellar tendinitis is also called jumper's knee or patellar tendinopathy. This condition happens when there is damage to the patellar tendon. Treatment for this condition depends on the stage of the condition and may include rest, ice, exercises, a knee brace, and surgery. Do not use the injured limb to support your body weight until your health care provider says that you can. Take over-the-counter and prescription medicines only as told by your health care provider. This information is not intended to replace advice given to you by your health care provider. Make sure you discuss any questions you have with your health care provider. Document Revised: 07/16/2021 Document Reviewed: 07/16/2021 Elsevier Patient Education  2024 [employer].

## 2023-10-14 ENCOUNTER — Other Ambulatory Visit: Payer: No Typology Code available for payment source | Admitting: Occupational Medicine

## 2023-10-15 DIAGNOSIS — M1712 Unilateral primary osteoarthritis, left knee: Secondary | ICD-10-CM | POA: Insufficient documentation

## 2023-10-15 DIAGNOSIS — M25562 Pain in left knee: Secondary | ICD-10-CM | POA: Insufficient documentation

## 2023-10-20 ENCOUNTER — Telehealth: Payer: Self-pay | Admitting: Registered Nurse

## 2023-10-20 ENCOUNTER — Encounter: Payer: Self-pay | Admitting: Registered Nurse

## 2023-10-20 DIAGNOSIS — M25562 Pain in left knee: Secondary | ICD-10-CM

## 2023-10-20 NOTE — Telephone Encounter (Signed)
Notified by supervisor Marcelino Duster patient out of work since 24 Oct 24.  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  Unable to work since 25 Oct 24.  Patient was planning to [facility] as established with orthopedics provider and was planning to have knee surgery in 2025  my chart message sent to patient regarding if on restrictions from orthopedics and if any further appt needed with [facility] provider clarification on 10/20/23 Patient last seen 10/15/23 and given new knee sleeve from clinic stock.  Reviewed patient injury report from supervisor this evening received via email

## 2023-10-27 NOTE — Telephone Encounter (Signed)
Spoke with supervisor patient has not returned to work still awaiting MRI approval for knee.

## 2023-11-02 NOTE — Telephone Encounter (Signed)
Spoke with patient via telephone MRI was denied by insurance so started 6 weeks of PT.  Was told she had spurs knee on xrays but unsure if meniscus or other soft tissue injury at this time her orthopedics provider stated need MRI to determine that.  Left greater than right knee pain but both were tight on PT evaluation this week.  Supervisor notified patient did not have modified work for her as seated recommended by orthopedics.  She was given Rx for tramadol but stated it did not work as well as alternating ibuprofen/aleve with tylenol.  Discussed with patient pick one nsaid either ibuprofen/motrin/advil 800mg  po q8h prn pain or aleve/naproxen/naprosyn 500mg  po every 12 hours take with food and tylenol/acetaminophen 1000mg  po q6h prn pain.  Continue ice 15 minutes QID and wearing neoprene knee sleeve for support.  Patient to do PT exercises as instructed by her therapist and keep follow up appt with orthopedics as scheduled.  Patient reported she has more knee pain with ambulatory tried crutches but caused pain in shoulders and armpits and knees.  Knee pain and swelling has decreased with rest.  Discussed with patient that prolonged sitting can cause back pain do seated arom/bicep curls/overhead press with a water bottle or small canned good and her PT exercises/cat/cow/knee to chest.  Patient stated she is going to discuss modified work availability with [employer] also and left message for supervisor Richardo Hanks again today also.  Discussed with patient will follow up with her again in 6 weeks via telephone.  Patient agreed with plan of care and had no further questions at this time.

## 2023-12-10 ENCOUNTER — Encounter: Payer: Self-pay | Admitting: Registered Nurse

## 2023-12-10 ENCOUNTER — Telehealth: Payer: Self-pay | Admitting: Registered Nurse

## 2023-12-10 DIAGNOSIS — A084 Viral intestinal infection, unspecified: Secondary | ICD-10-CM

## 2023-12-10 MED ORDER — ONDANSETRON 4 MG PO TBDP: 4.0000 mg | ORAL_TABLET | Freq: Two times a day (BID) | ORAL | 0 refills | Status: AC | PRN

## 2023-12-10 NOTE — Telephone Encounter (Signed)
Patient contacted RN Burna Mortimer stated diarrhea and vomiting started yesterday and wondering if she needs antibiotic.   I have recommended clear fluids and bland diet.  Avoid dairy/spicy, fried and large portions of meat while having nausea.  If vomiting hold po intake x 1 hour.  Then sips clear fluids like broths, ginger ale, power ade, gatorade, pedialyte may advance to soft/bland if no vomiting x 24 hours and appetite returned otherwise hydration main focus.  zofran ODT 4mg  po BID prn n/v electronic Rx sent to her pharmacy of choice.  She also has promethazine DM 6.25mg /15mg /63ml at home may take 5ml po QID prn n/v/cough.  Diarrhea and vomiting must be resolved x 48 hours before return to work onsite.  HR and supervisor notified excused absence 12/19-12/21/24.  [employer] handouts on vomiting and diarrhea sent to patient my chart.  Return to the clinic if symptoms persist or worsen; I have alerted the patient to call if high fever, dehydration, marked weakness, fainting, increased abdominal pain, blood in stool or vomit (red or black).   Coffee/Tea are diuretic recommended gatorade/nondairy popsicles/ginger ale/broths first line if possible or alternating tea/water.  RN Burna Mortimer notified patient of recommendations

## 2024-02-19 DIAGNOSIS — M25662 Stiffness of left knee, not elsewhere classified: Secondary | ICD-10-CM | POA: Insufficient documentation

## 2024-03-18 NOTE — Telephone Encounter (Signed)
 Per supervisor patient still undergoing PT post surgery on restrictions and does not have return to work date scheduled at this time.

## 2024-05-11 ENCOUNTER — Encounter: Payer: Self-pay | Admitting: Registered Nurse

## 2024-05-11 ENCOUNTER — Telehealth: Payer: Self-pay | Admitting: Registered Nurse

## 2024-05-11 DIAGNOSIS — G8929 Other chronic pain: Secondary | ICD-10-CM

## 2024-05-11 NOTE — Telephone Encounter (Signed)
 See tcon dated 05/11/24 patient reported returned to work end of May but this was first day I had seen her in [employer].  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  Still has some knee pain with twisting motions and follow up with orthopedics pending.  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  Discussed with patient to discuss concerns and symptoms with her orthopedic provider and may need additional PT for longer stair work.  She is to count rungs needed to ascend/descend ladders at work as PT only had 3-5 steps at therapy also.  Patient agreed with plan of care and had no further questions at that time.

## 2024-05-11 NOTE — Telephone Encounter (Signed)
 See tcon dated 12/10/23 patient stated she didn't recognize telephone number I was calling from so sent to spam/junk while out on medical leave. Patient reported returned to work end of May worked 5 days first week then only 2 days and this week first week with 5 days again and knee sore.  This was first day I had seen her in workcenter.  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  Discussed with patient to discuss concerns and symptoms with her orthopedic provider and may need additional PT for longer stair work.  She is to count rungs needed to ascend/descend ladders at work as PT only had 3-5 steps at therapy also.  Patient agreed with plan of care and had no further questions at that time.  A&Ox3 gait sure and steady in workcenter skin warm dry and pink respirations even and unlabored

## 2024-06-15 ENCOUNTER — Other Ambulatory Visit

## 2024-06-15 VITALS — BP 128/88 | Ht 66.5 in | Wt 311.0 lb

## 2024-06-15 DIAGNOSIS — Z0000 Encounter for general adult medical examination without abnormal findings: Secondary | ICD-10-CM

## 2024-06-15 NOTE — Progress Notes (Signed)
 [facility] labs.

## 2024-06-16 LAB — CMP12+LP+TP+TSH+6AC+CBC/D/PLT
ALT: 13 IU/L (ref 0–32)
AST: 17 IU/L (ref 0–40)
Albumin: 4.4 g/dL (ref 3.9–4.9)
Alkaline Phosphatase: 75 IU/L (ref 44–121)
BUN/Creatinine Ratio: 11 (ref 9–23)
BUN: 10 mg/dL (ref 6–24)
Basophils Absolute: 0.1 10*3/uL (ref 0.0–0.2)
Basos: 1 %
Bilirubin Total: 0.4 mg/dL (ref 0.0–1.2)
Calcium: 9.8 mg/dL (ref 8.7–10.2)
Chloride: 104 mmol/L (ref 96–106)
Chol/HDL Ratio: 3.3 ratio (ref 0.0–4.4)
Cholesterol, Total: 194 mg/dL (ref 100–199)
Creatinine, Ser: 0.93 mg/dL (ref 0.57–1.00)
EOS (ABSOLUTE): 0 10*3/uL (ref 0.0–0.4)
Eos: 1 %
Estimated CHD Risk: <0.5 times avg. (ref 0.0–1.0)
Free Thyroxine Index: 2.3 (ref 1.2–4.9)
GGT: 16 IU/L (ref 0–60)
Globulin, Total: 3.2 g/dL (ref 1.5–4.5)
Glucose: 90 mg/dL (ref 70–99)
HDL: 59 mg/dL (ref >39)
Hematocrit: 43.4 % (ref 34.0–46.6)
Hemoglobin: 13.2 g/dL (ref 11.1–15.9)
Immature Grans (Abs): 0 10*3/uL (ref 0.0–0.1)
Immature Granulocytes: 0 %
Iron: 92 ug/dL (ref 27–159)
LDH: 255 IU/L — ABNORMAL HIGH (ref 119–226)
LDL Chol Calc (NIH): 121 mg/dL — ABNORMAL HIGH (ref 0–99)
Lymphocytes Absolute: 2.5 10*3/uL (ref 0.7–3.1)
Lymphs: 35 %
MCH: 28.7 pg (ref 26.6–33.0)
MCHC: 30.4 g/dL — ABNORMAL LOW (ref 31.5–35.7)
MCV: 94 fL (ref 79–97)
Monocytes Absolute: 0.4 10*3/uL (ref 0.1–0.9)
Monocytes: 6 %
Neutrophils Absolute: 4.2 10*3/uL (ref 1.4–7.0)
Neutrophils: 57 %
Phosphorus: 3 mg/dL (ref 3.0–4.3)
Platelets: 413 10*3/uL (ref 150–450)
Potassium: 4.2 mmol/L (ref 3.5–5.2)
RBC: 4.6 x10E6/uL (ref 3.77–5.28)
RDW: 13.2 % (ref 11.7–15.4)
Sodium: 139 mmol/L (ref 134–144)
T3 Uptake Ratio: 31 % (ref 24–39)
T4, Total: 7.4 ug/dL (ref 4.5–12.0)
TSH: 1.07 u[IU]/mL (ref 0.450–4.500)
Total Protein: 7.6 g/dL (ref 6.0–8.5)
Triglycerides: 75 mg/dL (ref 0–149)
Uric Acid: 5.2 mg/dL (ref 2.6–6.2)
VLDL Cholesterol Cal: 14 mg/dL (ref 5–40)
WBC: 7.2 10*3/uL (ref 3.4–10.8)
eGFR: 76 mL/min/{1.73_m2} (ref >59)

## 2024-06-16 LAB — HGB A1C W/O EAG: Hgb A1c MFr Bld: 5.8 % — ABNORMAL HIGH (ref 4.8–5.6)

## 2024-06-20 ENCOUNTER — Ambulatory Visit: Payer: Self-pay | Admitting: Student

## 2024-08-10 NOTE — Telephone Encounter (Signed)
 Per epic patient read results note 06/20/24 per epic and [facility] form given to HR

## 2024-10-15 ENCOUNTER — Ambulatory Visit
Admission: RE | Admit: 2024-10-15 | Discharge: 2024-10-15 | Disposition: A | Source: Ambulatory Visit | Attending: Family Medicine | Admitting: Family Medicine

## 2024-10-15 ENCOUNTER — Other Ambulatory Visit: Payer: Self-pay | Admitting: Family Medicine

## 2024-10-15 DIAGNOSIS — Z1231 Encounter for screening mammogram for malignant neoplasm of breast: Secondary | ICD-10-CM

## 2024-11-10 ENCOUNTER — Ambulatory Visit: Admitting: Registered Nurse

## 2024-11-10 ENCOUNTER — Encounter: Payer: Self-pay | Admitting: Registered Nurse

## 2024-11-10 ENCOUNTER — Ambulatory Visit: Payer: Self-pay | Admitting: Registered Nurse

## 2024-11-10 VITALS — BP 142/78 | HR 82 | Temp 99.6°F

## 2024-11-10 DIAGNOSIS — J069 Acute upper respiratory infection, unspecified: Secondary | ICD-10-CM

## 2024-11-10 LAB — POC COVID19 BINAXNOW: SARS Coronavirus 2 Ag: NEGATIVE

## 2024-11-10 MED ORDER — SALINE SPRAY 0.65 % NA SOLN: 2.0000 | NASAL | 0 refills | Status: AC

## 2024-11-10 MED ORDER — LORATADINE 10 MG PO TABS: 10.0000 mg | ORAL_TABLET | Freq: Every day | ORAL | Status: AC

## 2024-11-10 MED ORDER — PROMETHAZINE-DM 6.25-15 MG/5ML PO SYRP: 5.0000 mL | ORAL_SOLUTION | Freq: Four times a day (QID) | ORAL | 1 refills | Status: AC | PRN

## 2024-11-10 MED ORDER — FLUTICASONE PROPIONATE 50 MCG/ACT NA SUSP: 1.0000 | Freq: Two times a day (BID) | NASAL | 6 refills | Status: AC

## 2024-11-10 NOTE — Patient Instructions (Signed)
 How to Perform a Sinus Rinse A sinus rinse is a home treatment that is used to rinse your sinuses with a germ-free (sterile) mixture of salt and water (saline solution). Sinuses are air-filled spaces in your skull that are behind the bones of your face and forehead. They open into your nasal cavity. A sinus rinse can help to clear mucus, dirt, dust, or pollen from your nasal cavity. You may do a sinus rinse when you have a cold, a virus, nasal allergy symptoms, a sinus infection, or stuffiness in your nose or sinuses. What are the risks? A sinus rinse is generally safe and effective. However, there are a few risks, which include: A burning sensation in your sinuses. This may happen if you do not make the saline solution as directed. Be sure to follow all directions when making the saline solution. Nasal irritation. Infection. This may be from unclean supplies or from contaminated water. Infection from contaminated water is rare, but possible. Do not do a sinus rinse if you have had ear or nasal surgery, ear infection, or plugged ears, unless recommended by your health care provider. Supplies needed: Saline solution or powder. Distilled or sterile water to mix with saline powder. You may use boiled and cooled tap water. Boil tap water for 5 minutes; cool until it is lukewarm. Use within 24 hours. Do not use regular tap water to mix with the saline solution. Neti pot or nasal rinse bottle. These supplies release the saline solution into your nose and through your sinuses. Neti pots and nasal rinse bottles can be purchased at [employer], a health food store, or online. How to perform a sinus rinse  Wash your hands with soap and water for at least 20 seconds. If soap and water are not available, use hand sanitizer. Wash your device according to the directions that came with the product and then dry it. Use the solution that comes with your product or one that is sold separately in stores.  Follow the mixing directions on the package to mix with sterile or distilled water. Fill the device with the amount of saline solution noted in the device instructions. Stand by a sink and tilt your head sideways over the sink. Place the spout of the device in your upper nostril (the one closer to the ceiling). Gently pour or squeeze the saline solution into your nasal cavity. The liquid should drain out from the lower nostril if you are not too congested. While rinsing, breathe through your open mouth. Gently blow your nose to clear any mucus and rinse solution. Blowing too hard may cause ear pain. Turn your head in the other direction and repeat in your other nostril. Clean and rinse your device with clean water and then air-dry it. Talk with your health care provider or pharmacist if you have questions about how to do a sinus rinse. Summary A sinus rinse is a home treatment that is used to rinse your sinuses with a sterile mixture of salt and water (saline solution). You may do a sinus rinse when you have a cold, a virus, nasal allergy symptoms, a sinus infection, or stuffiness in your nose or sinuses. A sinus rinse is generally safe and effective. Follow all instructions carefully. This information is not intended to replace advice given to you by your health care provider. Make sure you discuss any questions you have with your health care provider. Document Revised: 05/27/2021 Document Reviewed: 05/27/2021 Elsevier Patient Education  2024 [employer] Viral  Respiratory Infection A respiratory infection is an illness that affects part of the respiratory system, such as the lungs, nose, or throat. A respiratory infection that is caused by a virus is called a viral respiratory infection. Common types of viral respiratory infections include: A cold. The flu (influenza). A respiratory syncytial virus (RSV) infection. What are the causes? This condition is caused by a virus. The virus may  spread through contact with droplets or direct contact with infected people or their mucus or secretions. The virus may spread from person to person (is contagious). What are the signs or symptoms? Symptoms of this condition include: A stuffy or runny nose. A sore throat or cough. Shortness of breath or difficulty breathing. Yellow or green mucus (sputum). Other symptoms may include: A fever. Sweating or chills. Fatigue. Achy muscles. A headache. How is this diagnosed? This condition may be diagnosed based on: Your symptoms. A physical exam. Testing of secretions from the nose or throat. Chest X-ray. How is this treated? This condition may be treated with medicines, such as: Antiviral medicine. This may shorten the length of time a person has symptoms. Expectorants. These make it easier to cough up mucus. Decongestant nasal sprays. Acetaminophen or NSAIDs, such as ibuprofen, to relieve fever and pain. Antibiotic medicines are not prescribed for viral infections.This is because antibiotics are designed to kill bacteria. They do not kill viruses. Follow these instructions at home: Managing pain and congestion Take over-the-counter and prescription medicines only as told by your health care provider. If you have a sore throat, gargle with a mixture of salt and water 3-4 times a day or as needed. To make salt water, completely dissolve -1 tsp (3-6 g) of salt in 1 cup (237 mL) of warm water. Use nose drops made from salt water to ease congestion and soften raw skin around your nose. Take 2 tsp (10 mL) of honey at bedtime to lessen coughing at night. Do not give honey to children who are younger than 1 year. Drink enough fluid to keep your urine pale yellow. This helps prevent dehydration and helps loosen up mucus. General instructions  Rest as much as possible. Do not drink alcohol. Do not use any products that contain nicotine or tobacco. These products include cigarettes, chewing  tobacco, and vaping devices, such as e-cigarettes. If you need help quitting, ask your health care provider. Keep all follow-up visits. This is important. How is this prevented?     Get an annual flu shot. You may get the flu shot in late summer, fall, or winter. Ask your health care provider when you should get your flu shot. Avoid spreading your infection to other people. If you are sick: Wash your hands with soap and water often, especially after you cough or sneeze. Wash for at least 20 seconds. If soap and water are not available, use alcohol-based hand sanitizer. Cover your mouth when you cough. Cover your nose and mouth when you sneeze. Do not share cups or eating utensils. Clean commonly used objects often. Clean commonly touched surfaces. Stay home from work or school as told by your health care provider. Avoid contact with people who are sick during cold and flu season. This is generally fall and winter. Contact a health care provider if: Your symptoms last for 10 days or longer. Your symptoms get worse over time. You have severe sinus pain in your face or forehead. The glands in your jaw or neck become very swollen. You have shortness of breath. Get  help right away if you: Feel pain or pressure in your chest. Have trouble breathing. Faint or feel like you will faint. Have severe and persistent vomiting. Feel confused or disoriented. These symptoms may represent a serious problem that is an emergency. Do not wait to see if the symptoms will go away. Get medical help right away. Call your local emergency services (911 in the [place] Do not drive yourself to the hospital. Summary A respiratory infection is an illness that affects part of the respiratory system, such as the lungs, nose, or throat. A respiratory infection that is caused by a virus is called a viral respiratory infection. Common types of viral respiratory infections include a cold, influenza, and respiratory  syncytial virus (RSV) infection. Symptoms of this condition include a stuffy or runny nose, cough, fatigue, achy muscles, sore throat, and fevers or chills. Antibiotic medicines are not prescribed for viral infections. This is because antibiotics are designed to kill bacteria. They are not effective against viruses. This information is not intended to replace advice given to you by your health care provider. Make sure you discuss any questions you have with your health care provider. Document Revised: 03/14/2021 Document Reviewed: 03/14/2021 Elsevier Patient Education  2024 [employer].

## 2024-11-10 NOTE — Progress Notes (Signed)
 Established Patient Office Visit  Subjective   Patient ID: Shari Shields, female    DOB: [birth year]  Age: 48 y.o. MRN: [id number]  Chief Complaint  Patient presents with   Ear Pain    Ear pressure/pain    47y/o african american female here for evaluation congestion, ear pain OTC treatment has not been helping this week.  Has not been using bromphed DM, promethazine  DM, flonase  nasal or nasal saline.  Denied known sick contacts/n/v/d/f/c/ear discharge.  Has not had any mucous come out of nose or mouth.  It is stuck.      Review of Systems  Constitutional:  Positive for malaise/fatigue. Negative for chills, diaphoresis and fever.  HENT:  Positive for congestion, ear pain and sinus pain. Negative for ear discharge and nosebleeds.   Respiratory:  Negative for cough, sputum production, shortness of breath, wheezing and stridor.   Cardiovascular:  Negative for chest pain.  Gastrointestinal:  Negative for diarrhea, heartburn, nausea and vomiting.  Genitourinary:  Negative for dysuria.  Musculoskeletal:  Negative for myalgias and neck pain.  Skin:  Negative for itching and rash.  Neurological:  Negative for dizziness and headaches.  Endo/Heme/Allergies:  Positive for environmental allergies.  Psychiatric/Behavioral:  The patient does not have insomnia.       Objective:     BP (!) 142/78 (BP Location: Left Arm, Patient Position: Sitting, Cuff Size: Large)   Pulse 82   Temp 99.6 F (37.6 C) (Tympanic)   SpO2 97%    Physical Exam Vitals and nursing note reviewed.  Constitutional:      General: She is awake. She is not in acute distress.    Appearance: Normal appearance. She is well-developed and well-groomed. She is obese. She is ill-appearing. She is not toxic-appearing or diaphoretic.  HENT:     Head: Normocephalic and atraumatic.     Jaw: There is normal jaw occlusion. No swelling, pain on movement or malocclusion.     Salivary Glands: Right salivary gland is not  diffusely enlarged or tender. Left salivary gland is not diffusely enlarged or tender.     Right Ear: Hearing and external ear normal. No decreased hearing noted. No laceration, drainage, swelling or tenderness. A middle ear effusion is present. There is no impacted cerumen. No foreign body. No mastoid tenderness. No PE tube. No hemotympanum. Tympanic membrane is not injected, scarred, perforated, erythematous, retracted or bulging.     Left Ear: Hearing and external ear normal. No decreased hearing noted. No laceration, drainage, swelling or tenderness. A middle ear effusion is present. There is no impacted cerumen. No foreign body. No mastoid tenderness. No PE tube. No hemotympanum. Tympanic membrane is not injected, scarred, perforated, erythematous, retracted or bulging.     Ears:     Comments: Bilateral TMs intact air fluid level clear; no cerumen or debris in bilateral auditory canals    Nose: Nose normal. No congestion or rhinorrhea.     Right Nostril: No epistaxis.     Left Nostril: No epistaxis.     Right Turbinates: Enlarged and swollen. Not pale.     Left Turbinates: Enlarged and swollen. Not pale.     Right Sinus: No maxillary sinus tenderness or frontal sinus tenderness.     Left Sinus: No maxillary sinus tenderness or frontal sinus tenderness.     Comments: Nasal turbinates edema erythema clear discharge; bilateral allergic shiners; cobblestoning posterior pharynx    Mouth/Throat:     Lips: Pink. No lesions.  Mouth: Mucous membranes are moist. No oral lesions or angioedema.     Dentition: No gum lesions.     Tongue: No lesions. Tongue does not deviate from midline.     Palate: No mass and lesions.     Pharynx: Uvula midline. Pharyngeal swelling, posterior oropharyngeal erythema and postnasal drip present. No oropharyngeal exudate or uvula swelling.     Tonsils: No tonsillar exudate.  Eyes:     General: Lids are normal. Vision grossly intact. Gaze aligned appropriately. Allergic  shiner present. No scleral icterus.       Right eye: No discharge.        Left eye: No discharge.     Extraocular Movements: Extraocular movements intact.     Conjunctiva/sclera: Conjunctivae normal.     Pupils: Pupils are equal, round, and reactive to light.  Neck:     Trachea: Trachea and phonation normal.  Cardiovascular:     Rate and Rhythm: Normal rate and regular rhythm.     Pulses: Normal pulses.          Radial pulses are 2+ on the right side and 2+ on the left side.     Heart sounds: Normal heart sounds, S1 normal and S2 normal.  Pulmonary:     Effort: Pulmonary effort is normal. No respiratory distress.     Breath sounds: Normal breath sounds and air entry. No stridor or transmitted upper airway sounds. No decreased breath sounds, wheezing, rhonchi or rales.     Comments: Spoke full sentences without difficulty; no cough observed in exam room; BBS CTA Abdominal:     General: Abdomen is flat.  Musculoskeletal:        General: Normal range of motion.     Right hand: Normal strength. Normal capillary refill.     Left hand: Normal strength. Normal capillary refill.     Cervical back: Normal range of motion and neck supple. No edema, erythema, signs of trauma, rigidity, torticollis, tenderness or crepitus. No pain with movement. Normal range of motion.  Lymphadenopathy:     Head:     Right side of head: No submental, submandibular, tonsillar, preauricular, posterior auricular or occipital adenopathy.     Left side of head: No submental, submandibular, tonsillar, preauricular, posterior auricular or occipital adenopathy.     Cervical: No cervical adenopathy.     Right cervical: No superficial, deep or posterior cervical adenopathy.    Left cervical: No superficial, deep or posterior cervical adenopathy.  Skin:    General: Skin is warm and dry.     Capillary Refill: Capillary refill takes less than 2 seconds.     Coloration: Skin is not ashen, cyanotic, jaundiced, mottled, pale  or sallow.     Findings: No abrasion, abscess, acne, bruising, burn, ecchymosis, erythema, signs of injury, laceration, lesion, petechiae, rash or wound.     Comments: Anterior face/neck visually inspected  Neurological:     General: No focal deficit present.     Mental Status: She is alert and oriented to person, place, and time. Mental status is at baseline.     GCS: GCS eye subscore is 4. GCS verbal subscore is 5. GCS motor subscore is 6.     Cranial Nerves: No cranial nerve deficit, dysarthria or facial asymmetry.     Motor: Motor function is intact. No weakness, tremor, atrophy, abnormal muscle tone or seizure activity.     Coordination: Coordination is intact. Coordination normal.     Gait: Gait is intact. Gait normal.  Comments: In/out of chair and on/off exam table without difficulty; gait sure and steady in clinic; bilateral hand grasp equal 5/5  Psychiatric:        Attention and Perception: Attention and perception normal.        Mood and Affect: Mood and affect normal.        Speech: Speech normal.        Behavior: Behavior normal. Behavior is cooperative.        Thought Content: Thought content normal.        Cognition and Memory: Cognition and memory normal.        Judgment: Judgment normal.      Results for orders placed or performed in visit on 11/10/24  POC COVID-19 BinaxNow  Result Value Ref Range   SARS Coronavirus 2 Ag Negative Negative  Patient notified normal/negative covid test today and verbalized understanding    The 10-year ASCVD risk score (Arnett DK, et al., 2019) is: 1.9%    Assessment & Plan:   Problem List Items Addressed This Visit   None Visit Diagnoses       Viral URI    -  Primary   Relevant Orders   POC COVID-19 BinaxNow (Completed)      POC covid test x 1 now. Discussed with patient  non-covid and flu virus most common in community at this time but covid/influenza/metapneumovirus/adenovirus/RSV circulating in community.  Typically  lasting symptoms 7-10 days.  Mucous clear to yellow to green then done. Sp02 stable/97% RA today BBS CTA discussed no fluid or wheezing auscultated today e.g. pneumonia.  Honey 1 tablespoon po q4h prn cough.  Did not want Bromphed DM  30/2/10/64ml take 5ml po q6h prn cough/rhinitis today preferred promethazine  dextromethorphan 5ml po QID prn cough/congestion #120 RF0 electronic Rx sent to her pharmacy of choice discussed may cause drowsiness  Discussed has antihistamine cousin in it.  Discussed need to dry up post nasal drip and will help with post nasal drip  Discussed many viruses circulating in community.  Discussed hydrate with water to keep urine pale yellow clear and voiding every 2-4 hours while awake.  Patient may use normal saline nasal spray 2 sprays each nostril q2h wa as needed given 1 bottle from clinic stock today.  Electronic refill  flonase  nasal  50mcg 1 spray each nostril BID #[id number] Rf6 sent to pharmacy of choice   Avoid driving until she knows how she responds to promethazine  medication.  Discussed post nasal drip triggering cough need to get it dried up.   Avoid triggers if possible.  Shower prior to bedtime after work and am prn congestion Call or return to clinic as needed if these symptoms worsen or fail to improve as anticipated e.g. brown opaque mucous/bloody mucous/chunky mucous, wheezing, ear discharge, fever, chills or dyspnea develop.   [facility] handouts viral URI  and sinus rinse sent to patient my chart.  Patient verbalized understanding of instructions, agreed with plan of care and had no further questions at this time.   Return if symptoms worsen or fail to improve.    Ellouise DELENA Hope, NP

## 2024-11-22 ENCOUNTER — Encounter: Payer: Self-pay | Admitting: Registered Nurse

## 2024-11-22 ENCOUNTER — Ambulatory Visit: Admitting: Registered Nurse

## 2024-11-22 VITALS — HR 95 | Resp 18

## 2024-11-22 DIAGNOSIS — J00 Acute nasopharyngitis [common cold]: Secondary | ICD-10-CM

## 2024-11-22 DIAGNOSIS — R051 Acute cough: Secondary | ICD-10-CM

## 2024-11-22 MED ORDER — BENZONATATE 200 MG PO CAPS: 200.0000 mg | ORAL_CAPSULE | Freq: Three times a day (TID) | ORAL | 0 refills | Status: AC | PRN

## 2024-11-22 MED ORDER — PSEUDOEPH-BROMPHEN-DM 30-2-10 MG/5ML PO SYRP: 5.0000 mL | ORAL_SOLUTION | Freq: Four times a day (QID) | ORAL | 1 refills | Status: AC | PRN

## 2024-11-22 NOTE — Patient Instructions (Signed)
 Allergic Rhinitis, Adult  Allergic rhinitis is an allergic reaction that affects the mucous membrane inside the nose. The mucous membrane is the tissue that produces mucus. There are two types of allergic rhinitis: Seasonal. This type is also called hay fever and happens only during certain seasons. Perennial. This type can happen at any time of the year. Allergic rhinitis cannot be spread from person to person. This condition can be mild, bad, or very bad. It can develop at any age and may be outgrown. What are the causes? This condition is caused by allergens. These are things that can cause an allergic reaction. Allergens may differ for seasonal allergic rhinitis and perennial allergic rhinitis. Seasonal allergic rhinitis is caused by pollen. Pollen can come from grasses, trees, and weeds. Perennial allergic rhinitis may be caused by: Dust mites. Proteins in a pet's pee (urine), saliva, or dander. Dander is dead skin cells from a pet. Smoke, mold, or car fumes. Remains of or waste from insects such as cockroaches. What increases the risk? You are more likely to develop this condition if you have a family history of allergies or other conditions related to allergies, including: Allergic conjunctivitis. This is irritation and swelling of parts of the eyes and eyelids. Asthma. This condition affects the lungs and makes it hard to breathe. Atopic dermatitis or eczema. This is long term (chronic) irritation and swelling of the skin. Food allergies. What are the signs or symptoms? Symptoms of this condition include: Sneezing or coughing. A stuffy nose (nasal congestion), itchy nose, or nasal discharge. Itchy eyes and tearing of the eyes. A feeling of mucus dripping down the back of your throat (postnasal drip). This may cause a sore throat. Trouble sleeping. Tiredness. Headache. How is this diagnosed? This condition may be diagnosed with your symptoms, your medical history, and a physical  exam. Your health care provider may check for related conditions, such as: Asthma. Pink eye. This is eye swelling and irritation caused by infection (conjunctivitis). Ear infection. Upper respiratory infection. This is an infection in the nose, throat, or upper airways. You may also have tests to find out which allergens cause your symptoms. These may include skin tests or blood tests. How is this treated? There is no cure for this condition, but treatment can help control symptoms. Treatment may include: Taking medicines that block allergy symptoms, such as corticosteroids (anti-inflammatories) and antihistamines. Medicine may be given as a shot, nasal spray, or pill. Avoiding any allergens. Being exposed again and again to tiny amounts of allergens to help you build a defense against allergens (allergenimmunotherapy). This is done if other treatments have not helped. It may include: Allergy shots. These are injected medicines that have small amounts of an allergen in them. Sublingual immunotherapy. This involves taking small doses of a medicine with an allergen in it under your tongue. If these treatments do not work, your provider may prescribe newer, stronger medicines. Follow these instructions at home: Avoiding allergens Find out what you are allergic to and avoid those allergens. These are some things you can do to help avoid allergens: If you have perennial allergies: Replace carpet with wood, tile, or vinyl flooring. Carpet can trap dander and dust. Do not smoke. Do not allow smoking in your home Change your heating and air conditioning filters at least once a month. If you have seasonal allergies, take these steps during allergy season: Keep windows closed as much as possible. Plan outdoor activities when pollen counts are lowest. Check pollen counts before  you plan outdoor activities When coming indoors, change clothing and shower before sitting on furniture or bedding. If you  have a pet in the house that produces allergens: Keep the pet out of the bedroom. Vacuum, sweep, and dust regularly. General instructions Take over-the-counter and prescription medicines only as told by your provider. Drink enough fluid to keep your pee pale yellow. Where to find more information American Academy of Allergy, Asthma & Immunology: aaaai.org Contact a health care provider if: You have a fever. You develop a cough that does not go away. You make high-pitched whistling sounds when you breathe, most often when you breathe out (wheeze). Your symptoms slow you down or stop you from doing your normal activities each day. Get help right away if: You have shortness of breath. This symptom may be an emergency. Get help right away. Call 911. Do not wait to see if the symptoms will go away. Do not drive yourself to the hospital. This information is not intended to replace advice given to you by your health care provider. Make sure you discuss any questions you have with your health care provider. Document Revised: 08/18/2022 Document Reviewed: 08/18/2022 Elsevier Patient Education  2024 [employer]Nonallergic Rhinitis Nonallergic rhinitis is inflammation of the mucous membrane inside the nose. The mucous membrane is the tissue that produces mucus. This condition is different from having allergic rhinitis, which is an allergy that affects the nose. Allergic rhinitis occurs when the body's defense system, or immune system, reacts to a substance that a person is allergic to (allergen), such as pollen, pet dander, mold, or dust. Nonallergic rhinitis has many similar symptoms, but it is not caused by allergens. Nonallergic rhinitis can be an acute or chronic problem. This means it can be short-term or long-term. What are the causes? This condition may be caused by many different things. Some common types of nonallergic rhinitis include: Infectious rhinitis. This is usually caused by an  infection in the nose, throat, or upper airways (upper respiratory system). Vasomotor rhinitis. This is the most common type. It is caused by too much blood flow through your nose, and makes your nose swell. It is triggered by strong odors, cold air, stress, drinking alcohol, cigarette smoke, or changes in the weather. Occupational rhinitis. This type is caused by triggers in the workplace, such as chemicals, dust, animal dander, or air pollution. Hormonal rhinitis, in female teens and adults. This type is caused by an increase in the hormone estrogen and may happen during pregnancy, puberty, or monthly menstrual periods. Hormonal rhinitis gives you fewer symptoms when estrogen levels drop. Drug-induced rhinitis. Several types of medicines can cause this, such as medicines for high blood pressure or heart disease, aspirin, or NSAIDs. Nonallergic rhinitis with eosinophilia syndrome (NARES). This type is caused by having too much eosinophil, a type of white blood cell. Other causes include a reaction to eating hot or spicy foods. This does not usually cause long-term symptoms. In some cases, the cause of nonallergic rhinitis is not known. What increases the risk? You are more likely to develop this condition if: You are 44-3 years of age. You are female. People who are female are twice as likely to have this condition. What are the signs or symptoms? Common symptoms of this condition include: Stuffy nose (nasal congestion). Runny nose. A feeling of mucus dripping down the back of your throat (postnasal drip). Trouble sleeping. Tiredness, or fatigue. Other symptoms include: Sneezing. Coughing. Itchy nose. Bloodshot eyes. How is this diagnosed? This  type may be diagnosed based on: Your symptoms and medical history. A physical exam. Allergy testing to rule out allergic rhinitis. You may have skin tests or blood tests. Your health care provider may also take a swab of nasal discharge to look  for an increased number of eosinophils. This is done to confirm a diagnosis of NARES. How is this treated? Treatment for this condition depends on the cause. No single treatment works for everyone. Work with your provider to find the best treatment for you. Treatment may include: Avoiding the things that trigger your symptoms. Medicines to relieve congestion, such as: Steroid nasal spray. There are many types. You may need to try a few to find out which one works best. Decongestant medicine. This treats nasal congestion and may be given by mouth or as a nasal spray. These medicines are used only for a short time. Medicines to relieve a runny nose. These may include antihistamine medicines or decongestant nasal sprays. Nasal irrigation. This involves using a salt-water (saline) spray or saline container called a neti pot. Nasal irrigation helps to clear away mucus and keep your nasal passages moist. Surgery to remove part of your mucous membrane. This is done in severe cases if the condition has not improved after 6-12 months of treatment. Follow these instructions at home: Medicines Take or use over-the-counter and prescription medicines only as told by your provider. Do not stop using your medicine even if you start to feel better. Do not take NSAIDs, such as ibuprofen , or medicines that contain aspirin if they make your symptoms worse. Lifestyle Do not drink alcohol if it makes your symptoms worse. Do not use any products that contain nicotine or tobacco. These products include cigarettes, chewing tobacco, and vaping devices, such as e-cigarettes. If you need help quitting, ask your provider. Avoid secondhand smoke. General instructions Avoid triggers that make your symptoms worse. Use nasal irrigation as told by your provider. Sleep with the head of your bed raised. This may reduce nasal congestion when you sleep. Drink enough fluid to keep your pee (urine) pale yellow. Contact a health  care provider if: You have a fever. Your symptoms are getting worse at home. Your symptoms do not lessen with medicine. You develop new symptoms, especially a headache or nosebleed. Get help right away if: You have difficulty breathing. This symptom may be an emergency. Get help right away. Call 911. Do not wait to see if the symptoms will go away. Do not drive yourself to the hospital. This information is not intended to replace advice given to you by your health care provider. Make sure you discuss any questions you have with your health care provider. Document Revised: 08/12/2022 Document Reviewed: 08/12/2022 Elsevier Patient Education  2024 [employer].

## 2024-11-22 NOTE — Progress Notes (Signed)
 Established Patient Office Visit  Subjective   Patient ID: Shari Shields, female    DOB: Sep 16, 1976  Age: 47 y.o. MRN: [id number]  Chief Complaint  Patient presents with   Cough    Still bad when lying down to bed at night    48y/o established african american female last seen 11/10/24 for post nasal drip/cough given refill promethazine  DM as had worked well for her in the past suspected viral URI at that time.  Continued post nasal drip most likely vasomotor/allergic as afebrile/denied congestion/hemoptysis/wheezing/dyspnea/dysphasia/dysphagia.  Would like to try different medication for cough.  Has not needed to use albuterol  inhaler.  Did not find tessalon  pearles at home needs refill. Taking her claritin  but not singulair  at bedtime.  Using nasal saline prn and flonase  daily.      Review of Systems  Constitutional:  Negative for chills, diaphoresis, fever and malaise/fatigue.  HENT:  Negative for congestion, ear discharge, ear pain, nosebleeds and sore throat.   Respiratory:  Positive for cough. Negative for hemoptysis, shortness of breath, wheezing and stridor.   Cardiovascular:  Negative for chest pain and palpitations.  Gastrointestinal:  Negative for diarrhea, nausea and vomiting.  Musculoskeletal:  Negative for back pain, myalgias and neck pain.  Neurological:  Negative for dizziness and headaches.  Endo/Heme/Allergies:  Positive for environmental allergies.  Psychiatric/Behavioral:  The patient does not have insomnia.       Objective:     Pulse 95   Resp 18   SpO2 100%    Physical Exam Vitals reviewed.  Constitutional:      General: She is awake. She is not in acute distress.    Appearance: Normal appearance. She is well-developed and well-groomed. She is obese. She is not ill-appearing, toxic-appearing or diaphoretic.  HENT:     Head: Normocephalic and atraumatic.     Jaw: There is normal jaw occlusion.     Salivary Glands: Right salivary gland is not  diffusely enlarged or tender. Left salivary gland is not diffusely enlarged or tender.     Right Ear: Hearing and external ear normal. No decreased hearing noted. No laceration, drainage or swelling.     Left Ear: Hearing and external ear normal. No decreased hearing noted. No laceration, drainage or swelling.     Nose: Nose normal. No congestion or rhinorrhea.     Right Nostril: No epistaxis.     Left Nostril: No epistaxis.     Right Turbinates: Enlarged and swollen. Not pale.     Left Turbinates: Enlarged and swollen. Not pale.     Right Sinus: No maxillary sinus tenderness or frontal sinus tenderness.     Left Sinus: No maxillary sinus tenderness or frontal sinus tenderness.     Comments: Bilateral nasal turbinates edema erythema clear discharge mild nasal congestion audible in exam room    Mouth/Throat:     Lips: Pink. No lesions.     Mouth: Mucous membranes are moist. No oral lesions or angioedema.     Dentition: No gum lesions.     Tongue: No lesions. Tongue does not deviate from midline.     Palate: No mass and lesions.     Pharynx: Uvula midline. Pharyngeal swelling and postnasal drip present. No oropharyngeal exudate, posterior oropharyngeal erythema or uvula swelling.     Tonsils: No tonsillar exudate or tonsillar abscesses.     Comments: Cobblestoning posterior pharynx; bilateral allergic shiners Eyes:     General: Lids are normal. Vision grossly intact. Gaze aligned appropriately.  Allergic shiner present. No scleral icterus.       Right eye: No discharge.        Left eye: No discharge.     Extraocular Movements: Extraocular movements intact.     Conjunctiva/sclera: Conjunctivae normal.     Pupils: Pupils are equal, round, and reactive to light.  Neck:     Trachea: Trachea and phonation normal.  Cardiovascular:     Rate and Rhythm: Normal rate and regular rhythm.     Pulses: Normal pulses.          Radial pulses are 2+ on the right side and 2+ on the left side.     Heart  sounds: Normal heart sounds, S1 normal and S2 normal.  Pulmonary:     Effort: Pulmonary effort is normal. No respiratory distress.     Breath sounds: Normal breath sounds and air entry. No stridor or transmitted upper airway sounds. No decreased breath sounds, wheezing, rhonchi or rales.     Comments: Spoke full sentences without difficulty; no cough observed in exam room; sp02 100% RA BBS CTA Abdominal:     Palpations: Abdomen is soft.  Musculoskeletal:        General: Normal range of motion.     Right hand: Normal strength. Normal capillary refill.     Left hand: Normal strength. Normal capillary refill.     Cervical back: Normal range of motion and neck supple. No swelling, edema, deformity, erythema, signs of trauma, lacerations, rigidity, torticollis, tenderness or crepitus. No pain with movement. Normal range of motion.     Thoracic back: No swelling, edema, deformity, signs of trauma, lacerations, spasms or tenderness. Normal range of motion.     Right lower leg: No edema.     Left lower leg: No edema.  Lymphadenopathy:     Head:     Right side of head: No submental, submandibular, tonsillar, preauricular, posterior auricular or occipital adenopathy.     Left side of head: No submental, submandibular, tonsillar, preauricular, posterior auricular or occipital adenopathy.     Cervical: No cervical adenopathy.     Right cervical: No superficial, deep or posterior cervical adenopathy.    Left cervical: No superficial, deep or posterior cervical adenopathy.  Skin:    General: Skin is warm and dry.     Capillary Refill: Capillary refill takes less than 2 seconds.     Coloration: Skin is not ashen, cyanotic, jaundiced, mottled, pale or sallow.     Findings: No abrasion, abscess, acne, bruising, burn, ecchymosis, erythema, signs of injury, laceration, lesion, petechiae, rash or wound.     Nails: There is no clubbing.     Comments: Anterior face/neck/hands/arms visually inspected   Neurological:     General: No focal deficit present.     Mental Status: She is alert and oriented to person, place, and time. Mental status is at baseline.     GCS: GCS eye subscore is 4. GCS verbal subscore is 5. GCS motor subscore is 6.     Cranial Nerves: Cranial nerves 2-12 are intact. No cranial nerve deficit, dysarthria or facial asymmetry.     Sensory: Sensation is intact.     Motor: Motor function is intact. No weakness, tremor, atrophy, abnormal muscle tone or seizure activity.     Coordination: Coordination is intact. Coordination normal.     Gait: Gait is intact. Gait normal.     Comments: In/out of chair and on/off exam table without difficulty; gait sure and steady in  clinic; bilateral hand grasp equal 5/5  Psychiatric:        Attention and Perception: Attention and perception normal.        Mood and Affect: Mood and affect normal.        Speech: Speech normal.        Behavior: Behavior normal. Behavior is cooperative.        Thought Content: Thought content normal.        Cognition and Memory: Cognition and memory normal.        Judgment: Judgment normal.      No results found for any visits on 11/22/24.    The 10-year ASCVD risk score (Arnett DK, et al., 2019) is: 2.1%    Assessment & Plan:   Problem List Items Addressed This Visit   None Visit Diagnoses       Acute rhinitis    -  Primary     Acute cough         Discussed post nasal drip causing cough  Reminded patient sudafed may cause drowsiness or insomnia, increased BP/HR.  Sudafed in bromphed DM will need to show drivers license at pharmacy.  Cough can last a few weeks after viral URI.  If fever/chills/purulent mucous or bloody mucous notify clinic staff.  Discussed sp02 100% BBS CTA does not need antibiotic or steroids at this time.  Suspect allergic post nasal drip or due to cold weather vasomotor but cannot rule out continued virus.  Trial bromphed DM 5ml po QID prn rhinitis/cough #120 RF1 electronic Rx  sent to her pharmacy of choice.  Refilled tessalon  pearles 200mg  po TID prn cough #30 RF0.  Consider restarting singulair  10mg  po at bedtime at home.  Continue normal saline nasal spray 2 sprays each nostril q2h wa as needed. flonase  50mcg 1 spray each nostril BID  Patient denied personal or family history of ENT cancer.  OTC antihistamine of choice claritin /zyrtec 10mg  po daily.  Avoid triggers if possible.  Shower prior to bedtime if exposed to triggers.  If allergic dust/dust mites recommend mattress/pillow covers/encasements; washing linens, vacuuming, sweeping, dusting weekly.  Call or return to clinic as needed if these symptoms worsen or fail to improve as anticipated.   Exitcare handout on cough, allergic rhinitis and sinus rinse given to patient.  Patient verbalized understanding of instructions, agreed with plan of care and had no further questions at this time.  P2:  Avoidance and hand washing.   Return if symptoms worsen or fail to improve.    Ellouise DELENA Hope, NP
